# Patient Record
Sex: Female | Born: 1975
Health system: Southern US, Community
[De-identification: ages and names within clinical notes are randomized; demographics above are authoritative.]

## PROBLEM LIST (undated history)

## (undated) DIAGNOSIS — D649 Anemia, unspecified: Secondary | ICD-10-CM

## (undated) DIAGNOSIS — R7303 Prediabetes: Secondary | ICD-10-CM

## (undated) HISTORY — DX: Prediabetes: R73.03

## (undated) HISTORY — DX: Anemia, unspecified: D64.9

---

## 1998-08-09 HISTORY — PX: BREAST BIOPSY: SHX20

## 2002-08-09 HISTORY — PX: TUBAL LIGATION: SHX77

## 2006-10-14 ENCOUNTER — Ambulatory Visit: Payer: Self-pay | Admitting: Gastroenterology

## 2006-11-15 ENCOUNTER — Ambulatory Visit: Payer: Self-pay | Admitting: Gastroenterology

## 2006-12-13 ENCOUNTER — Ambulatory Visit: Payer: Self-pay | Admitting: Gastroenterology

## 2007-08-23 ENCOUNTER — Encounter: Admission: RE | Admit: 2007-08-23 | Discharge: 2007-08-23 | Payer: Self-pay | Admitting: Family Medicine

## 2010-05-18 ENCOUNTER — Encounter: Admission: RE | Admit: 2010-05-18 | Discharge: 2010-05-18 | Payer: Self-pay | Admitting: Family Medicine

## 2013-07-24 ENCOUNTER — Ambulatory Visit: Payer: Self-pay | Admitting: Family Medicine

## 2013-07-27 ENCOUNTER — Ambulatory Visit: Payer: Self-pay | Admitting: Family Medicine

## 2013-08-20 ENCOUNTER — Encounter: Payer: Self-pay | Admitting: Obstetrics & Gynecology

## 2013-08-21 ENCOUNTER — Ambulatory Visit: Payer: Self-pay | Admitting: Obstetrics and Gynecology

## 2013-08-21 ENCOUNTER — Ambulatory Visit: Payer: Self-pay | Admitting: Obstetrics & Gynecology

## 2013-08-21 ENCOUNTER — Encounter: Payer: Self-pay | Admitting: Obstetrics & Gynecology

## 2013-11-14 ENCOUNTER — Ambulatory Visit (INDEPENDENT_AMBULATORY_CARE_PROVIDER_SITE_OTHER): Payer: BC Managed Care – PPO | Admitting: Gynecology

## 2013-11-14 ENCOUNTER — Ambulatory Visit: Payer: Self-pay | Admitting: Obstetrics and Gynecology

## 2013-11-14 ENCOUNTER — Encounter: Payer: Self-pay | Admitting: Gynecology

## 2013-11-14 VITALS — BP 116/70 | Resp 18 | Ht 62.25 in | Wt 253.0 lb

## 2013-11-14 DIAGNOSIS — Z975 Presence of (intrauterine) contraceptive device: Secondary | ICD-10-CM

## 2013-11-14 DIAGNOSIS — N920 Excessive and frequent menstruation with regular cycle: Secondary | ICD-10-CM

## 2013-11-14 DIAGNOSIS — Z0000 Encounter for general adult medical examination without abnormal findings: Secondary | ICD-10-CM

## 2013-11-14 DIAGNOSIS — Z01419 Encounter for gynecological examination (general) (routine) without abnormal findings: Secondary | ICD-10-CM

## 2013-11-14 LAB — POCT URINALYSIS DIPSTICK
UROBILINOGEN UA: NEGATIVE
pH, UA: 5

## 2013-11-14 LAB — HEMOGLOBIN, FINGERSTICK: Hemoglobin, fingerstick: 12.4 g/dL (ref 12.0–16.0)

## 2013-11-14 NOTE — Patient Instructions (Signed)

## 2013-11-14 NOTE — Progress Notes (Signed)
38 y.o. Divorced  Hispanic female   G4P4 here for annual exam. Pt is currently sexually active.  Pt has an IUD placed a number of years afo but cannot recall when.  IUD was placed for menorrhagia and not contraception.  No dsypareunia.  No LMP recorded.          Sexually active: yes  The current method of family planning is tubal ligation and IUD.    Exercising: no  The patient does not participate in regular exercise at present. Last pap: 08/16/12 NEG HR HPV Alcohol: no Tobacco: no BSE: yes  Mammogram: 2014 Normal   Hgb: 12,4  ; Urine; Trace Leuks, Trace Protein    Health Maintenance  Topic Date Due  . Pap Smear  10/01/1993  . Tetanus/tdap  10/01/1994  . Influenza Vaccine  03/09/2014    Family History  Problem Relation Age of Onset  . Diabetes Maternal Grandmother   . Congestive Heart Failure Maternal Grandmother   . Depression Mother     There are no active problems to display for this patient.   Past Medical History  Diagnosis Date  . Anemia     Past Surgical History  Procedure Laterality Date  . Tubal ligation    . Breast biopsy      benign    Allergies: Review of patient's allergies indicates no known allergies.  Current Outpatient Prescriptions  Medication Sig Dispense Refill  . Acetaminophen (TYLENOL PO) Take by mouth as needed.      . IBUPROFEN PO Take by mouth as needed.      Marland Kitchen levonorgestrel (MIRENA) 20 MCG/24HR IUD 1 each by Intrauterine route once.       No current facility-administered medications for this visit.    ROS: Pertinent items are noted in HPI.  Exam:    Ht 5' 2.25" (1.581 m)  Wt 253 lb (114.76 kg)  BMI 45.91 kg/m2 Weight change: @WEIGHTCHANGE @ Last 3 height recordings:  Ht Readings from Last 3 Encounters:  11/14/13 5' 2.25" (1.581 m)   General appearance: alert, cooperative and appears stated age Head: Normocephalic, without obvious abnormality, atraumatic Neck: no adenopathy, no carotid bruit, no JVD, supple, symmetrical,  trachea midline and thyroid not enlarged, symmetric, no tenderness/mass/nodules Lungs: clear to auscultation bilaterally Breasts: normal appearance, no masses or tenderness, pendulous Heart: regular rate and rhythm, S1, S2 normal, no murmur, click, rub or gallop Abdomen: soft, non-tender; bowel sounds normal; no masses,  no organomegaly Extremities: extremities normal, atraumatic, no cyanosis or edema Skin: Skin color, texture, turgor normal. No rashes or lesions Lymph nodes: Cervical, supraclavicular, and axillary nodes normal. no inguinal nodes palpated Neurologic: Grossly normal   Pelvic: External genitalia:  no lesions              Urethra: normal appearing urethra with no masses, tenderness or lesions              Bartholins and Skenes: normal                 Vagina: normal appearing vagina with normal color and discharge, no lesions              Cervix: normal appearance, IUD strings not seen              Pap taken: no        Bimanual Exam:  Uterus:  uterus is normal size, shape, consistency and nontender, limited by habitus  Adnexa:    no masses                                      Rectovaginal: Confirms                                      Anus:  normal sphincter tone, no lesions  A: well woman Menorrhagia IUD in place     P: pap smear not done IUD for menorrhagia working well, strings not seen  counseled on breast self exam, adequate intake of calcium and vitamin D, diet and exercise return annually or prn   An After Visit Summary was printed and given to the patient.

## 2014-06-10 ENCOUNTER — Encounter: Payer: Self-pay | Admitting: Gynecology

## 2014-07-09 ENCOUNTER — Telehealth: Payer: Self-pay | Admitting: Gynecology

## 2014-07-09 NOTE — Telephone Encounter (Signed)
lmtcb to rs aex from 11/19/14

## 2014-11-19 ENCOUNTER — Ambulatory Visit: Payer: Self-pay | Admitting: Gynecology

## 2014-12-23 ENCOUNTER — Encounter (INDEPENDENT_AMBULATORY_CARE_PROVIDER_SITE_OTHER): Payer: Self-pay

## 2014-12-23 ENCOUNTER — Ambulatory Visit (INDEPENDENT_AMBULATORY_CARE_PROVIDER_SITE_OTHER): Payer: BLUE CROSS/BLUE SHIELD | Admitting: Physician Assistant

## 2014-12-23 ENCOUNTER — Encounter: Payer: Self-pay | Admitting: Physician Assistant

## 2014-12-23 VITALS — BP 130/95 | HR 74 | Temp 97.4°F | Ht 62.0 in | Wt 259.0 lb

## 2014-12-23 DIAGNOSIS — J0110 Acute frontal sinusitis, unspecified: Secondary | ICD-10-CM

## 2014-12-23 MED ORDER — AMOXICILLIN-POT CLAVULANATE 875-125 MG PO TABS: 1.0000 | ORAL_TABLET | Freq: Two times a day (BID) | ORAL | Status: DC

## 2014-12-23 NOTE — Progress Notes (Signed)
Subjective:     Patient ID: Linda Shepherd, female   DOB: 08-13-1975, 39 y.o.   MRN: [id number]  HPI Pt with 4-5 day hx of sinus pain and pressure She is having assoc S/T, dry cough, and general malaise Pt started sinus med this am  Review of Systems  Constitutional: Negative for activity change, appetite change and fatigue.  HENT: Positive for congestion, postnasal drip, rhinorrhea, sinus pressure, sore throat and voice change.   Respiratory: Positive for cough. Negative for shortness of breath, wheezing and stridor.        Objective:   Physical Exam  Constitutional: She appears well-developed and well-nourished.  HENT:  Right Ear: External ear normal.  Left Ear: External ear normal.  Mouth/Throat: Oropharynx is clear and moist. No oropharyngeal exudate.  TM's with fluid bilat + frontal sinus TTP  Neck: Neck supple.  Cardiovascular: Normal rate, regular rhythm and normal heart sounds.   No murmur heard. Pulmonary/Chest: Breath sounds normal. She has no wheezes. She has no rales.  Lymphadenopathy:    She has no cervical adenopathy.  Nursing note and vitals reviewed.      Assessment:     sinusitis    Plan:     OTC antihist/decongest Fluids  Rest Augmentin 875mg  bid #20 F/U prn

## 2014-12-23 NOTE — Patient Instructions (Signed)

## 2015-02-05 ENCOUNTER — Ambulatory Visit: Payer: BLUE CROSS/BLUE SHIELD | Admitting: Obstetrics and Gynecology

## 2015-02-13 ENCOUNTER — Encounter: Payer: Self-pay | Admitting: Obstetrics and Gynecology

## 2015-02-13 ENCOUNTER — Ambulatory Visit (INDEPENDENT_AMBULATORY_CARE_PROVIDER_SITE_OTHER): Payer: BLUE CROSS/BLUE SHIELD | Admitting: Obstetrics and Gynecology

## 2015-02-13 VITALS — BP 112/76 | HR 68 | Resp 16 | Ht 62.5 in | Wt 259.0 lb

## 2015-02-13 DIAGNOSIS — N920 Excessive and frequent menstruation with regular cycle: Secondary | ICD-10-CM | POA: Diagnosis not present

## 2015-02-13 DIAGNOSIS — Z01419 Encounter for gynecological examination (general) (routine) without abnormal findings: Secondary | ICD-10-CM

## 2015-02-13 DIAGNOSIS — Z0000 Encounter for general adult medical examination without abnormal findings: Secondary | ICD-10-CM

## 2015-02-13 LAB — POCT URINALYSIS DIPSTICK
UROBILINOGEN UA: NEGATIVE
pH, UA: 5

## 2015-02-13 MED ORDER — LORAZEPAM 1 MG PO TABS: ORAL_TABLET | ORAL | Status: DC

## 2015-02-13 MED ORDER — MISOPROSTOL 200 MCG PO TABS: ORAL_TABLET | ORAL | Status: DC

## 2015-02-13 NOTE — Patient Instructions (Signed)
Intrauterine Device Insertion Most often, an intrauterine device (IUD) is inserted into the uterus to prevent pregnancy. There are 2 types of IUDs available:  Copper IUD--This type of IUD creates an environment that is not favorable to sperm survival. The mechanism of action of the copper IUD is not known for certain. It can stay in place for 10 years.  Hormone IUD--This type of IUD contains the hormone progestin (synthetic progesterone). The progestin thickens the cervical mucus and prevents sperm from entering the uterus, and it also thins the uterine lining. There is no evidence that the hormone IUD prevents implantation. One hormone IUD can stay in place for up to 5 years, and a different hormone IUD can stay in place for up to 3 years. An IUD is the most cost-effective birth control if left in place for the full duration. It may be removed at any time. LET YOUR HEALTH CARE PROVIDER KNOW ABOUT:  Any allergies you have.  All medicines you are taking, including vitamins, herbs, eye drops, creams, and over-the-counter medicines.  Previous problems you or members of your family have had with the use of anesthetics.  Any blood disorders you have.  Previous surgeries you have had.  Possibility of pregnancy.  Medical conditions you have. RISKS AND COMPLICATIONS  Generally, intrauterine device insertion is a safe procedure. However, as with any procedure, complications can occur. Possible complications include:  Accidental puncture (perforation) of the uterus.  Accidental placement of the IUD either in the muscle layer of the uterus (myometrium) or outside the uterus. If this happens, the IUD can be found essentially floating around the bowels and must be taken out surgically.  The IUD may fall out of the uterus (expulsion). This is more common in women who have recently had a child.   Pregnancy in the fallopian tube (ectopic).  Pelvic inflammatory disease (PID), which is infection of  the uterus and fallopian tubes. The risk of PID is slightly increased in the first 20 days after the IUD is placed, but the overall risk is still very low. BEFORE THE PROCEDURE  Schedule the IUD insertion for when you will have your menstrual period or right after, to make sure you are not pregnant. Placement of the IUD is better tolerated shortly after a menstrual cycle.  You may need to take tests or be examined to make sure you are not pregnant.  You may be required to take a pregnancy test.  You may be required to get checked for sexually transmitted infections (STIs) prior to placement. Placing an IUD in someone who has an infection can make the infection worse.  You may be given a pain reliever to take 1 or 2 hours before the procedure.  An exam will be performed to determine the size and position of your uterus.  Ask your health care provider about changing or stopping your regular medicines. PROCEDURE   A tool (speculum) is placed in the vagina. This allows your health care provider to see the lower part of the uterus (cervix).  The cervix is prepped with a medicine that lowers the risk of infection.  You may be given a medicine to numb each side of the cervix (intracervical or paracervical block). This is used to block and control any discomfort with insertion.  A tool (uterine sound) is inserted into the uterus to determine the length of the uterine cavity and the direction the uterus may be tilted.  A slim instrument (IUD inserter) is inserted through the cervical   canal and into your uterus.  The IUD is placed in the uterine cavity and the insertion device is removed.  The nylon string that is attached to the IUD and used for eventual IUD removal is trimmed. It is trimmed so that it lays high in the vagina, just outside the cervix. AFTER THE PROCEDURE  You may have bleeding after the procedure. This is normal. It varies from light spotting for a few days to menstrual-like  bleeding.  You may have mild cramping. Document Released: 03/24/2011 Document Revised: 05/16/2013 Document Reviewed: 01/14/2013 ExitCare Patient Information 2015 [employer]. This information is not intended to replace advice given to you by your health care provider. Make sure you discuss any questions you have with your health care provider.  

## 2015-02-13 NOTE — Progress Notes (Signed)
39 y.o. G58P4 Divorced Hispanic female here for annual exam.  The patient has had a mirena IUD in for heavy cycles, it has been in for over 5 years. Last year the strings weren't seen. Prior to the mirena she was anemic. Now just occasional spotting, occasional light cycle. Sexually active, same partner x 2 years, not using condoms. No dyspareunia. No h/o dysplasia, no h/o STD's.  PCP:   Redge Gainer, MD  No LMP recorded. Patient is not currently having periods (Reason: IUD).          Sexually active: Yes.    The current method of family planning is tubal ligation and IUD.    Exercising: No.  The patient does not participate in regular exercise at present. Smoker:  no  Health Maintenance: Pap:  08/16/12 neg hr hpv History of abnormal Pap:  Yes, years ago f/u papsmear normal ever since. MMG:  4 years ago normal  Colonoscopy:  n/a BMD:   n/a  Result  n/a TDaP:  2012 Screening Labs:  Hb today: 11.5 , Urine today: wbc's ++   reports that she has never smoked. She has never used smokeless tobacco. She reports that she does not drink alcohol or use illicit drugs.  Past Medical History  Diagnosis Date  . Anemia     Past Surgical History  Procedure Laterality Date  . Tubal ligation  2004    postpartum  . Breast biopsy  2000    benign    Current Outpatient Prescriptions  Medication Sig Dispense Refill  . IBUPROFEN PO Take by mouth as needed.    Marland Kitchen levonorgestrel (MIRENA) 20 MCG/24HR IUD 1 each by Intrauterine route once.    Marland Kitchen LORazepam (ATIVAN) 1 MG tablet Take 1 tab, 1 hour prior to procedure 1 tablet 0  . misoprostol (CYTOTEC) 200 MCG tablet 2 tabs per vagina 6-12 hours prior to IUD insertion 2 tablet 0   No current facility-administered medications for this visit.    Family History  Problem Relation Age of Onset  . Diabetes Maternal Grandmother   . Congestive Heart Failure Maternal Grandmother   . Depression Mother     ROS:  Pertinent items are noted in HPI.  Otherwise, a  comprehensive ROS was negative.  Exam:   BP 112/76 mmHg  Pulse 68  Resp 16  Ht 5' 2.5" (1.588 m)  Wt 259 lb (117.482 kg)  BMI 46.59 kg/m2    General appearance: alert, cooperative and appears stated age Head: Normocephalic, without obvious abnormality, atraumatic Neck: no adenopathy, supple, symmetrical, trachea midline and thyroid normal to inspection and palpation Lungs: clear to auscultation bilaterally Breasts: normal appearance, no masses or tenderness Heart: regular rate and rhythm Abdomen: soft, non-tender; bowel sounds normal; no masses,  no organomegaly Extremities: extremities normal, atraumatic, no cyanosis or edema Skin: Skin color, texture, turgor normal. No rashes or lesions Lymph nodes: Cervical, supraclavicular, and axillary nodes normal. No abnormal inguinal nodes palpated Neurologic: Grossly normal  Pelvic: External genitalia:  no lesions              Urethra:  normal appearing urethra with no masses, tenderness or lesions              Bartholins and Skenes: normal                 Vagina: normal appearing vagina with normal color and discharge, no lesions              Cervix: IUD string 1  cm              Pap taken: No. Bimanual Exam:  Uterus:  normal size, contour, position, consistency, mobility, non-tender              Adnexa: no mass, fullness, tenderness              Rectovaginal: Yes.  .  Confirms.              Anus:  normal sphincter tone, no lesions  Chaperone was present for exam.  Assessment:   Well woman visit with normal exam. H/O anemia from menorrhagia Due for a new mirena IUD  Plan: Yearly mammogram recommended after age 58.  Recommended self breast exam.  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  Yes.  .   See orders. Declines STD testing today  After visit summary provided.

## 2015-02-14 LAB — LIPID PANEL
CHOLESTEROL: 204 mg/dL — AB (ref 0–200)
HDL: 48 mg/dL (ref >=46)
LDL Cholesterol: 126 mg/dL — ABNORMAL HIGH (ref 0–99)
Total CHOL/HDL Ratio: 4.3 Ratio
Triglycerides: 148 mg/dL (ref <150)
VLDL: 30 mg/dL (ref 0–40)

## 2015-02-14 LAB — COMPREHENSIVE METABOLIC PANEL
ALK PHOS: 76 U/L (ref 39–117)
ALT: 14 U/L (ref 0–35)
AST: 15 U/L (ref 0–37)
Albumin: 3.9 g/dL (ref 3.5–5.2)
BILIRUBIN TOTAL: 0.3 mg/dL (ref 0.2–1.2)
BUN: 13 mg/dL (ref 6–23)
CALCIUM: 9.6 mg/dL (ref 8.4–10.5)
CO2: 28 mEq/L (ref 19–32)
Chloride: 99 mEq/L (ref 96–112)
Creat: 0.72 mg/dL (ref 0.50–1.10)
Glucose, Bld: 85 mg/dL (ref 70–99)
Potassium: 4.3 mEq/L (ref 3.5–5.3)
SODIUM: 138 meq/L (ref 135–145)
TOTAL PROTEIN: 6.8 g/dL (ref 6.0–8.3)

## 2015-02-14 LAB — CBC
HEMATOCRIT: 38.2 % (ref 36.0–46.0)
Hemoglobin: 12.1 g/dL (ref 12.0–15.0)
MCH: 27.3 pg (ref 26.0–34.0)
MCHC: 31.7 g/dL (ref 30.0–36.0)
MCV: 86.2 fL (ref 78.0–100.0)
MPV: 10.4 fL (ref 8.6–12.4)
Platelets: 254 10*3/uL (ref 150–400)
RBC: 4.43 MIL/uL (ref 3.87–5.11)
RDW: 15 % (ref 11.5–15.5)
WBC: 6.7 10*3/uL (ref 4.0–10.5)

## 2015-02-17 LAB — HEMOGLOBIN, FINGERSTICK: HEMOGLOBIN, FINGERSTICK: 11.5 g/dL — AB (ref 12.0–16.0)

## 2015-04-03 ENCOUNTER — Telehealth: Payer: Self-pay | Admitting: Obstetrics and Gynecology

## 2015-04-03 NOTE — Telephone Encounter (Signed)
Called patient to review benefits. Patient is agreeable. Advised patient to call with cycle. Routing to provider for final review.

## 2015-05-13 NOTE — Telephone Encounter (Signed)
Spoke with patient. Patient states that Dr.Jertson was waiting for records to verify when she had her Mirena placed. Advised I will speak with Dr.Jertson regarding these records to see if she has received them. Will return call with further recommendations for scheduling. Patient is agreeable.

## 2015-05-13 NOTE — Telephone Encounter (Signed)
Spoke with patient to review benefits for iud insertion. Patient believes she needs to have records sent to our office prior to scheduling. Not noted in office visit. Transferred to [facility] to discuss prior to scheduling.

## 2015-05-15 NOTE — Telephone Encounter (Signed)
Left message to call Meleana Commerford at [phone]. 

## 2015-05-15 NOTE — Telephone Encounter (Signed)
I don't need her records prior to scheduling unless the patient thinks she hasn't had the IUD in for 5 years. She did have an IUD in her uterus in 10/11 when she had a CT. Does she remember if that was this IUD, if so she is due.

## 2015-05-21 ENCOUNTER — Telehealth: Payer: Self-pay | Admitting: Obstetrics and Gynecology

## 2015-05-21 NOTE — Telephone Encounter (Signed)
Left message for pt to call regarding medical records release mailed to office. Where are we getting records from?

## 2015-05-28 NOTE — Telephone Encounter (Signed)
Left message to call Kaitlyn at [phone]. 

## 2015-06-03 NOTE — Telephone Encounter (Signed)
Spoke with patient. Advised of message as seen below from [place]. Patient is agreeable. States she has only had one IUD so it must have been the same as the one in the CT scan from 2011. Per patient does not have regular cycles with IUD. IUD removal and reinsertion scheduled for 11/2 at 9 am with Dr.Jertson. Patient is agreeable to date and time. Pre procedure instructions given.  Motrin instructions given. Motrin=Advil=Ibuprofen, 800 mg one hour before appointment. Eat a meal and hydrate well before appointment. States she was given a Valium by Dr.Jertson. Advised she will need a driver to and from the appointment. Patient is agreeable.  Routing to provider for final review. Patient agreeable to disposition. Will close encounter.

## 2015-06-09 ENCOUNTER — Telehealth: Payer: Self-pay | Admitting: Obstetrics and Gynecology

## 2015-06-09 NOTE — Telephone Encounter (Signed)
Spoke with patient. Patient would like to reschedule IUD removal and reinsertion. Would like to move this up to 11/1. Appointment moved to 11/1 at 8:12 am with Dr.Jertson. Patient is agreeable to date and time.   Routing to provider for final review. Patient agreeable to disposition. Will close encounter.

## 2015-06-09 NOTE — Telephone Encounter (Signed)
Patient would like to reschedule upcoming iud replacement appointment. Patient would like to come in tomorrow if possible. (fyi: I did not cx upcoming appointment).

## 2015-06-10 ENCOUNTER — Ambulatory Visit (INDEPENDENT_AMBULATORY_CARE_PROVIDER_SITE_OTHER): Payer: BLUE CROSS/BLUE SHIELD | Admitting: Obstetrics and Gynecology

## 2015-06-10 ENCOUNTER — Encounter: Payer: Self-pay | Admitting: Obstetrics and Gynecology

## 2015-06-10 VITALS — BP 102/72 | HR 60 | Resp 16 | Wt 258.0 lb

## 2015-06-10 DIAGNOSIS — Z30433 Encounter for removal and reinsertion of intrauterine contraceptive device: Secondary | ICD-10-CM | POA: Diagnosis not present

## 2015-06-10 NOTE — Patient Instructions (Signed)

## 2015-06-10 NOTE — Progress Notes (Signed)
Patient ID: Linda Shepherd, female   DOB: [birth year], 39 y.o.   MRN: [id number] GYNECOLOGY  VISIT   HPI: 39 y.o.   Divorced  Hispanic  female   G70P4 with No LMP recorded. Patient is not currently having periods (Reason: IUD).   Here for IUD removal and a new Mirena IUD inserted.   GYNECOLOGIC HISTORY: No LMP recorded. Patient is not currently having periods (Reason: IUD). Contraception:Tubal Ligation and IUD Menopausal hormone therapy: N/A        OB History    Gravida Para Term Preterm AB TAB SAB Ectopic Multiple Living   4 4        4          Patient Active Problem List   Diagnosis Date Noted  . IUD (intrauterine device) in place 11/14/2013    Past Medical History  Diagnosis Date  . Anemia     Past Surgical History  Procedure Laterality Date  . Tubal ligation  2004    postpartum  . Breast biopsy  2000    benign    Current Outpatient Prescriptions  Medication Sig Dispense Refill  . IBUPROFEN PO Take by mouth as needed.    Marland Kitchen levonorgestrel (MIRENA) 20 MCG/24HR IUD 1 each by Intrauterine route once.    Marland Kitchen levonorgestrel (MIRENA) 20 MCG/24HR IUD 1 Intra Uterine Device (1 each total) by Intrauterine route once. 1 Intra Uterine Device 0  . LORazepam (ATIVAN) 1 MG tablet Take 1 tab, 1 hour prior to procedure 1 tablet 0  . misoprostol (CYTOTEC) 200 MCG tablet 2 tabs per vagina 6-12 hours prior to IUD insertion 2 tablet 0   No current facility-administered medications for this visit.     ALLERGIES: Review of patient's allergies indicates no known allergies.  Family History  Problem Relation Age of Onset  . Diabetes Maternal Grandmother   . Congestive Heart Failure Maternal Grandmother   . Depression Mother     Social History   Social History  . Marital Status: Divorced    Spouse Name: N/A  . Number of Children: N/A  . Years of Education: N/A   Occupational History  . Not on file.   Social History Main Topics  . Smoking status: Never Smoker   . Smokeless  tobacco: Never Used  . Alcohol Use: No     Comment: occ wine  . Drug Use: No  . Sexual Activity:    Partners: Male    Birth Control/ Protection: IUD   Other Topics Concern  . Not on file   Social History Narrative    Review of Systems  All other systems reviewed and are negative.   PHYSICAL EXAMINATION:    There were no vitals taken for this visit.    General appearance: alert, cooperative and appears stated age  Pelvic: External genitalia:  no lesions              Urethra:  normal appearing urethra with no masses, tenderness or lesions              Bartholins and Skenes: normal                 Vagina: normal appearing vagina with normal color and discharge, no lesions              Cervix: no lesions and IUD strings seen               Chaperone was present for exam.  The risks of the mirena IUD  were reviewed with the patient, including infection, abnormal bleeding and uterine perfortion. Consent was signed.  A speculum was placed in the vagina, the IUD was removed with ringed forceps. The cervix was cleansed with betadine. A tenaculum was placed on the cervix, the uterus sounded to 9 cm. The cervix was dilated to a 5 Hagar dilator.  The mirena IUD was inserted without difficulty. The string were cut to 3-4 cm. The tenaculum was removed. Slight oozing from the tenaculum site was stopped with pressure.   The patient tolerated the procedure well.    ASSESSMENT Mirena IUD removal and reinsertion    PLAN F/U in 1 month Call with any concerns   An After Visit Summary was printed and given to the patient.

## 2015-06-11 ENCOUNTER — Ambulatory Visit: Payer: BLUE CROSS/BLUE SHIELD | Admitting: Obstetrics and Gynecology

## 2015-09-30 ENCOUNTER — Ambulatory Visit (INDEPENDENT_AMBULATORY_CARE_PROVIDER_SITE_OTHER): Payer: BLUE CROSS/BLUE SHIELD | Admitting: Family Medicine

## 2015-09-30 ENCOUNTER — Encounter: Payer: Self-pay | Admitting: Family Medicine

## 2015-09-30 VITALS — BP 119/77 | HR 96 | Temp 98.8°F | Ht 62.5 in | Wt 259.6 lb

## 2015-09-30 DIAGNOSIS — J101 Influenza due to other identified influenza virus with other respiratory manifestations: Secondary | ICD-10-CM

## 2015-09-30 DIAGNOSIS — R059 Cough, unspecified: Secondary | ICD-10-CM

## 2015-09-30 DIAGNOSIS — J0110 Acute frontal sinusitis, unspecified: Secondary | ICD-10-CM

## 2015-09-30 DIAGNOSIS — J029 Acute pharyngitis, unspecified: Secondary | ICD-10-CM

## 2015-09-30 DIAGNOSIS — R05 Cough: Secondary | ICD-10-CM | POA: Diagnosis not present

## 2015-09-30 DIAGNOSIS — R509 Fever, unspecified: Secondary | ICD-10-CM

## 2015-09-30 LAB — POCT INFLUENZA A/B
INFLUENZA A, POC: POSITIVE — AB
INFLUENZA B, POC: NEGATIVE

## 2015-09-30 LAB — POCT RAPID STREP A (OFFICE): Rapid Strep A Screen: NEGATIVE

## 2015-09-30 MED ORDER — OSELTAMIVIR PHOSPHATE 75 MG PO CAPS: 75.0000 mg | ORAL_CAPSULE | Freq: Two times a day (BID) | ORAL | Status: DC

## 2015-09-30 NOTE — Progress Notes (Signed)
Subjective:  Patient ID: Linda Shepherd, female    DOB: [birth year]  Age: 40 y.o. MRN: [id number]  CC: Generalized Body Aches   HPI Linda Shepherd presents for   Patient presents with dry cough runny stuffy nose. Diffuse headache of moderate intensity. Patient also has chills and subjective fever. Body aches  in the legs, shoulders, and torso as well. Has sapped the energy . Onset 3 days ago.   History Linda Shepherd has a past medical history of Anemia.   Linda Shepherd has past surgical history that includes Tubal ligation (2004) and Breast biopsy (2000).   Her family history includes Congestive Heart Failure in her maternal grandmother; Depression in her mother; Diabetes in her maternal grandmother.Linda Shepherd reports that Linda Shepherd has never smoked. Linda Shepherd has never used smokeless tobacco. Linda Shepherd reports that Linda Shepherd does not drink alcohol or use illicit drugs.    ROS Review of Systems  Constitutional: Positive for fever, chills and appetite change.  HENT: Positive for congestion and rhinorrhea. Negative for ear pain, nosebleeds, postnasal drip, sinus pressure and sore throat.   Respiratory: Negative for chest tightness and shortness of breath.   Cardiovascular: Negative for chest pain.  Musculoskeletal: Positive for myalgias.  Skin: Negative for rash.  Neurological: Positive for headaches.    Objective:  BP 119/77 mmHg  Pulse 96  Temp(Src) 98.8 F (37.1 C) (Oral)  Ht 5' 2.5" (1.588 m)  Wt 259 lb 9.6 oz (117.754 kg)  BMI 46.70 kg/m2  SpO2 98%  BP Readings from Last 3 Encounters:  09/30/15 119/77  06/10/15 102/72  02/13/15 112/76    Wt Readings from Last 3 Encounters:  09/30/15 259 lb 9.6 oz (117.754 kg)  06/10/15 258 lb (117.028 kg)  02/13/15 259 lb (117.482 kg)     Physical Exam  Constitutional: Linda Shepherd appears well-developed and well-nourished.  HENT:  Head: Normocephalic and atraumatic.  Right Ear: Tympanic membrane and external ear normal. No decreased hearing is noted.  Left Ear: Tympanic  membrane and external ear normal. No decreased hearing is noted.  Nose: Mucosal edema present. Right sinus exhibits no frontal sinus tenderness. Left sinus exhibits no frontal sinus tenderness.  Mouth/Throat: No oropharyngeal exudate or posterior oropharyngeal erythema.  Neck: No Brudzinski's sign noted.  Pulmonary/Chest: Breath sounds normal. No respiratory distress.  Lymphadenopathy:       Head (right side): No preauricular adenopathy present.       Head (left side): No preauricular adenopathy present.       Right cervical: No superficial cervical adenopathy present.      Left cervical: No superficial cervical adenopathy present.     Lab Results  Component Value Date   WBC 6.7 02/13/2015   HGB 12.1 02/13/2015   HCT 38.2 02/13/2015   PLT 254 02/13/2015   GLUCOSE 85 02/13/2015   CHOL 204* 02/13/2015   TRIG 148 02/13/2015   HDL 48 02/13/2015   LDLCALC 126* 02/13/2015   ALT 14 02/13/2015   AST 15 02/13/2015   NA 138 02/13/2015   K 4.3 02/13/2015   CL 99 02/13/2015   CREATININE 0.72 02/13/2015   BUN 13 02/13/2015   CO2 28 02/13/2015    Ct Abdomen Pelvis W Wo Contrast  05/18/2010  Clinical Data: Bilateral flank and right left lower quadrant pain for 3 days.  Nausea.  Hematuria.  Diarrhea.  CT ABDOMEN AND PELVIS WITHOUT AND WITH CONTRAST  Technique:  Multidetector CT imaging of the abdomen and pelvis was performed without contrast material in one or both body regions, followed  by contrast material(s) and further sections in one or both body regions.  Contrast: 100 ml Omnipaque-300  Comparison: 08/23/2007  Findings: There are no renal or ureteral calculi.  There are no bladder calculi.  The liver, spleen, pancreas, and adrenal glands are normal.  There is a 4 cm low density lesion in the right ovary.  The right ovary is more prominent than on the prior study.  The left ovary is normal.  There is evidence of an IUD in place as well as clips from previous bilateral tubal ligation.  The  terminal ileum and appendix are normal.  No diverticular disease.  No significant osseous abnormality.  IMPRESSION:  1.  4 cm low density lesion in the right ovary, nonspecific. Pelvic ultrasound may be useful for further evaluation of this indeterminate ovarian lesion. 2.  Otherwise essentially normal exam. Provider: Cleda Mccreedy   Assessment & Plan:   Linda Shepherd was seen today for generalized body aches.  Diagnoses and all orders for this visit:  Sore throat -     POCT Influenza A/B -     POCT rapid strep A  Fever, unspecified -     POCT Influenza A/B -     POCT rapid strep A  Cough -     POCT Influenza A/B -     POCT rapid strep A  Acute frontal sinusitis, recurrence not specified  Influenza A  Other orders -     oseltamivir (TAMIFLU) 75 MG capsule; Take 1 capsule (75 mg total) by mouth 2 (two) times daily.      I have discontinued Linda Shepherd's misoprostol. I am also having her start on oseltamivir. Additionally, I am having her maintain her IBUPROFEN PO and levonorgestrel.  Meds ordered this encounter  Medications  . oseltamivir (TAMIFLU) 75 MG capsule    Sig: Take 1 capsule (75 mg total) by mouth 2 (two) times daily.    Dispense:  10 capsule    Refill:  0     Follow-up: Return if symptoms worsen or fail to improve.  Claretta Fraise, M.D.

## 2015-10-15 ENCOUNTER — Encounter: Payer: Self-pay | Admitting: Pediatrics

## 2015-10-15 ENCOUNTER — Ambulatory Visit (INDEPENDENT_AMBULATORY_CARE_PROVIDER_SITE_OTHER): Payer: BLUE CROSS/BLUE SHIELD | Admitting: Pediatrics

## 2015-10-15 VITALS — BP 118/80 | HR 88 | Temp 98.3°F | Ht 62.5 in | Wt 263.6 lb

## 2015-10-15 DIAGNOSIS — J209 Acute bronchitis, unspecified: Secondary | ICD-10-CM

## 2015-10-15 DIAGNOSIS — J069 Acute upper respiratory infection, unspecified: Secondary | ICD-10-CM | POA: Diagnosis not present

## 2015-10-15 MED ORDER — AZITHROMYCIN 250 MG PO TABS: ORAL_TABLET | ORAL | Status: DC

## 2015-10-15 NOTE — Patient Instructions (Signed)
Flonase two sprays twice a day  Ibuprofen three times a day  If getting worse start the antibiotic If not improving by Sunday, start the antibiotic

## 2015-10-15 NOTE — Progress Notes (Signed)
    Subjective:    Patient ID: Linda Shepherd, female    DOB: 1975-10-12, 40 y.o.   MRN: [id number]  CC: Cough; Sore Throat; and Ear Pain   HPI: Linda Shepherd is a 40 y.o. female presenting for Cough; Sore Throat; and Ear Pain  Had flu 2/21, was prescribed tamiflu but told that she shouldn't take it  Feeling better, almost back to normal Then 4 days ago started feeling sick again, sore throat, lots ears are sore, throat is sore, coughing up green-grey No fevers    Depression screen PHQ 2/9 12/23/2014  Decreased Interest 0  Down, Depressed, Hopeless 0  PHQ - 2 Score 0     Relevant past medical, surgical, family and social history reviewed and updated as indicated. Interim medical history since our last visit reviewed. Allergies and medications reviewed and updated.    ROS: Per HPI unless specifically indicated above  History  Smoking status  . Never Smoker   Smokeless tobacco  . Never Used    Past Medical History Patient Active Problem List   Diagnosis Date Noted  . IUD (intrauterine device) in place 11/14/2013       Objective:    BP 118/80 mmHg  Pulse 88  Temp(Src) 98.3 F (36.8 C) (Oral)  Ht 5' 2.5" (1.588 m)  Wt 263 lb 9.6 oz (119.568 kg)  BMI 47.41 kg/m2  SpO2 98%  Wt Readings from Last 3 Encounters:  10/15/15 263 lb 9.6 oz (119.568 kg)  09/30/15 259 lb 9.6 oz (117.754 kg)  06/10/15 258 lb (117.028 kg)    Gen: NAD, alert, cooperative with exam, NCAT, coughing, congested EYES: EOMI, no scleral injection or icterus ENT:  TMs dull gray b/l, OP with mild erythema LYMPH: no cervical LAD CV: NRRR, normal S1/S2, no murmur, distal pulses 2+ b/l Resp: CTABL, no wheezes, normal WOB Abd: +BS, soft, NTND.  Ext: No edema, warm Neuro: Alert and oriented MSK: normal muscle bulk     Assessment & Plan:    Floe was seen today for cough, sore throat and ear pain, symptoms ongoing for over a week.  Diagnoses and all orders for this visit:  Acute  bronchitis, unspecified organism -     azithromycin (ZITHROMAX) 250 MG tablet; Take 2 the first day and then one each day after.  Acute URI Symptomatic care  Follow up plan: Return if symptoms worsen or fail to improve.  Assunta Found, MD [facility] 10/15/2015, 7:18 PM

## 2015-11-07 ENCOUNTER — Encounter: Payer: Self-pay | Admitting: Gastroenterology

## 2015-11-07 ENCOUNTER — Ambulatory Visit (INDEPENDENT_AMBULATORY_CARE_PROVIDER_SITE_OTHER): Payer: BLUE CROSS/BLUE SHIELD | Admitting: Gastroenterology

## 2015-11-07 ENCOUNTER — Other Ambulatory Visit: Payer: Self-pay

## 2015-11-07 VITALS — BP 136/81 | HR 88 | Temp 98.3°F | Ht 63.0 in | Wt 258.2 lb

## 2015-11-07 DIAGNOSIS — K625 Hemorrhage of anus and rectum: Secondary | ICD-10-CM

## 2015-11-07 DIAGNOSIS — K6289 Other specified diseases of anus and rectum: Secondary | ICD-10-CM | POA: Diagnosis not present

## 2015-11-07 MED ORDER — PEG-KCL-NACL-NASULF-NA ASC-C 100 G PO SOLR: 1.0000 | ORAL | Status: DC

## 2015-11-07 MED ORDER — HYDROCORTISONE 2.5 % RE CREA: 1.0000 "application " | TOPICAL_CREAM | Freq: Two times a day (BID) | RECTAL | Status: DC

## 2015-11-07 NOTE — Patient Instructions (Signed)
1. Anusol cream apply rectally twice daily for 2 weeks. 2. Colonoscopy as scheduled. Please see separate instructions.

## 2015-11-07 NOTE — Progress Notes (Signed)
Primary Care Physician:  Barney Drain, MD  Primary Gastroenterologist:  Barney Drain, MD   Chief Complaint  Patient presents with  . Anal Fissure    HPI:  Linda Shepherd is a 40 y.o. female here for further evaluation of rectal pain and rectal bleeding. She was seen remotely by our practice but I do not have access to those records at this time.   She complains of several week h/o persistent rectal pain associated with BMs, rectal bleeding. Stools are soft but large. She denies any medication for these symptoms. Tries to stay regular with healthy diet and adequate water intake.  No abdominal pain. Appetite is good. BM every other day. No heartburn. No prior colonoscopy.   She had the flu back in 09/2015 but otherwise has been healthy.    Current Outpatient Prescriptions  Medication Sig Dispense Refill  . IBUPROFEN PO Take by mouth as needed.    Marland Kitchen levonorgestrel (MIRENA) 20 MCG/24HR IUD 1 Intra Uterine Device (1 each total) by Intrauterine route once. 1 Intra Uterine Device 0  . hydrocortisone (ANUSOL-HC) 2.5 % rectal cream Place 1 application rectally 2 (two) times daily. 30 g 0  . peg 3350 powder (MOVIPREP) 100 g SOLR Take 1 kit (200 g total) by mouth as directed. 1 kit 0   No current facility-administered medications for this visit.    Allergies as of 11/07/2015  . (No Known Allergies)    Past Medical History  Diagnosis Date  . Anemia     Past Surgical History  Procedure Laterality Date  . Tubal ligation  2004    postpartum  . Breast biopsy  2000    benign    Family History  Problem Relation Age of Onset  . Diabetes Maternal Grandmother   . Congestive Heart Failure Maternal Grandmother   . Depression Mother   . Colon cancer Neg Hx     Social History   Social History  . Marital Status: Divorced    Spouse Name: N/A  . Number of Children: 4  . Years of Education: N/A   Occupational History  . works in Programmer, applications and as an Veterinary surgeon at [employer]  . Smoking status: Never Smoker   . Smokeless tobacco: Never Used  . Alcohol Use: No     Comment: occ wine  . Drug Use: No  . Sexual Activity:    Partners: Male    Birth Control/ Protection: IUD   Other Topics Concern  . Not on file   Social History Narrative      ROS:  General: Negative for anorexia, weight loss, fever, chills, fatigue, weakness. Eyes: Negative for vision changes.  ENT: Negative for hoarseness, difficulty swallowing , nasal congestion. CV: Negative for chest pain, angina, palpitations, dyspnea on exertion, peripheral edema.  Respiratory: Negative for dyspnea at rest, dyspnea on exertion, cough, sputum, wheezing.  GI: See history of present illness. GU:  Negative for dysuria, hematuria, urinary incontinence, urinary frequency, nocturnal urination.  MS: Negative for joint pain, low back pain.  Derm: Negative for rash or itching.  Neuro: Negative for weakness, abnormal sensation, seizure, frequent headaches, memory loss, confusion.  Psych: Negative for anxiety, depression, suicidal ideation, hallucinations.  Endo: Negative for unusual weight change.  Heme: Negative for bruising or bleeding. Allergy: Negative for rash or hives.    Physical Examination:  BP 136/81 mmHg  Pulse 88  Temp(Src) 98.3 F (36.8 C)  Ht '5\' 3"'$  (1.6 m)  Wt 258 lb 3.2 oz (117.119 kg)  BMI 45.75 kg/m2   General: Well-nourished, well-developed in no acute distress.  Head: Normocephalic, atraumatic.   Eyes: Conjunctiva pink, no icterus. Mouth: Oropharyngeal mucosa moist and pink , no lesions erythema or exudate. Neck: Supple without thyromegaly, masses, or lymphadenopathy.  Lungs: Clear to auscultation bilaterally.  Heart: Regular rate and rhythm, no murmurs rubs or gallops.  Abdomen: Bowel sounds are normal, nontender, nondistended, no hepatosplenomegaly or masses, no abdominal bruits or    hernia , no rebound or guarding.   Rectal:  deferred Extremities: No lower extremity edema. No clubbing or deformities.  Neuro: Alert and oriented x 4 , grossly normal neurologically.  Skin: Warm and dry, no rash or jaundice.   Psych: Alert and cooperative, normal mood and affect.  Labs: Lab Results  Component Value Date   WBC 6.7 02/13/2015   HGB 12.1 02/13/2015   HCT 38.2 02/13/2015   MCV 86.2 02/13/2015   PLT 254 02/13/2015   Lab Results  Component Value Date   CREATININE 0.72 02/13/2015   BUN 13 02/13/2015   NA 138 02/13/2015   K 4.3 02/13/2015   CL 99 02/13/2015   CO2 28 02/13/2015   Lab Results  Component Value Date   ALT 14 02/13/2015   AST 15 02/13/2015   ALKPHOS 76 02/13/2015   BILITOT 0.3 02/13/2015     Imaging Studies: No results found.

## 2015-11-07 NOTE — Assessment & Plan Note (Signed)
40 y/o English speaking Hispanic female who presents with several week history of intermittent rectal bleeding associated with rectal pain/painful defecation. Denies hard stools. History of anal fissure in the remote past. Suspect benign etiology such as anal fissure versus hemorrhoids. She has never had a colonoscopy. Offered colonoscopy for diagnostic purposes.  I have discussed the risks, alternatives, benefits with regards to but not limited to the risk of reaction to medication, bleeding, infection, perforation and the patient is agreeable to proceed. Written consent to be obtained.  Start anusol cream bid for 14 days. Keep stools soft. Patient refuses medication for this and will increase dietary fiber and water consumption.

## 2015-11-10 NOTE — Progress Notes (Signed)
CC'D TO PCP °

## 2015-12-08 ENCOUNTER — Telehealth: Payer: Self-pay

## 2015-12-08 NOTE — Telephone Encounter (Signed)
noted 

## 2015-12-08 NOTE — Telephone Encounter (Signed)
Pt is changing job and her insurance will be changing. She would like to wait until she has the new insurance

## 2015-12-12 ENCOUNTER — Ambulatory Visit (HOSPITAL_COMMUNITY)
Admission: RE | Admit: 2015-12-12 | Payer: BLUE CROSS/BLUE SHIELD | Source: Ambulatory Visit | Admitting: Gastroenterology

## 2015-12-12 ENCOUNTER — Encounter (HOSPITAL_COMMUNITY): Admission: RE | Payer: Self-pay | Source: Ambulatory Visit

## 2015-12-12 SURGERY — COLONOSCOPY
Anesthesia: Moderate Sedation

## 2015-12-18 ENCOUNTER — Telehealth: Payer: Self-pay | Admitting: Family

## 2015-12-18 NOTE — Telephone Encounter (Signed)
Patient last seen, October 15, 2015.  Complains of ear pain and wants script sent in?  Please advise.

## 2015-12-19 ENCOUNTER — Ambulatory Visit (INDEPENDENT_AMBULATORY_CARE_PROVIDER_SITE_OTHER): Payer: BLUE CROSS/BLUE SHIELD | Admitting: Family Medicine

## 2015-12-19 ENCOUNTER — Encounter: Payer: Self-pay | Admitting: Family Medicine

## 2015-12-19 VITALS — BP 130/88 | HR 81 | Temp 98.3°F | Ht 63.0 in | Wt 261.0 lb

## 2015-12-19 DIAGNOSIS — J069 Acute upper respiratory infection, unspecified: Secondary | ICD-10-CM

## 2015-12-19 MED ORDER — FLUTICASONE PROPIONATE 50 MCG/ACT NA SUSP: 1.0000 | Freq: Two times a day (BID) | NASAL | Status: DC | PRN

## 2015-12-19 NOTE — Telephone Encounter (Signed)
Pt needs to be seen

## 2015-12-19 NOTE — Progress Notes (Signed)
BP 130/88 mmHg  Pulse 81  Temp(Src) 98.3 F (36.8 C) (Oral)  Ht 5' 3"  (1.6 m)  Wt 261 lb (118.389 kg)  BMI 46.25 kg/m2   Subjective:    Patient ID: Linda Shepherd, female    DOB: 05-31-1976, 40 y.o.   MRN: [id number]  HPI: Linda Shepherd is a 40 y.o. female presenting on 12/19/2015 for Ear Pain   HPI Ear pain and nasal congestion and sore throat Patient comes in today with complaints of left ear pain and sinus congestion and postnasal drainage and sore throat that's been going on for the past few days. She usually gets allergies as time year but never to this point where it has caused the congestion to back up into her ear. She denies any fevers or chills or shortness of breath or wheezing. She denies any sick contacts that she knows of.  Relevant past medical, surgical, family and social history reviewed and updated as indicated. Interim medical history since our last visit reviewed. Allergies and medications reviewed and updated.  Review of Systems  Constitutional: Negative for fever and chills.  HENT: Positive for congestion, ear pain, postnasal drip, rhinorrhea, sinus pressure, sneezing and sore throat. Negative for ear discharge.   Eyes: Negative for pain, redness and visual disturbance.  Respiratory: Positive for cough. Negative for chest tightness and shortness of breath.   Cardiovascular: Negative for chest pain and leg swelling.  Genitourinary: Negative for dysuria and difficulty urinating.  Musculoskeletal: Negative for back pain and gait problem.  Skin: Negative for rash.  Neurological: Negative for light-headedness and headaches.  Psychiatric/Behavioral: Negative for behavioral problems and agitation.  All other systems reviewed and are negative.   Per HPI unless specifically indicated above     Medication List       This list is accurate as of: 12/19/15  6:22 PM.  Always use your most recent med list.               fluticasone 50 MCG/ACT nasal spray    Commonly known as:  FLONASE  Place 1 spray into both nostrils 2 (two) times daily as needed for allergies or rhinitis.     IBUPROFEN PO  Take by mouth as needed.     levonorgestrel 20 MCG/24HR IUD  Commonly known as:  MIRENA  1 Intra Uterine Device (1 each total) by Intrauterine route once.           Objective:    BP 130/88 mmHg  Pulse 81  Temp(Src) 98.3 F (36.8 C) (Oral)  Ht 5' 3"  (1.6 m)  Wt 261 lb (118.389 kg)  BMI 46.25 kg/m2  Wt Readings from Last 3 Encounters:  12/19/15 261 lb (118.389 kg)  11/07/15 258 lb 3.2 oz (117.119 kg)  10/15/15 263 lb 9.6 oz (119.568 kg)    Physical Exam  Constitutional: She is oriented to person, place, and time. She appears well-developed and well-nourished. No distress.  HENT:  Right Ear: Tympanic membrane, external ear and ear canal normal.  Left Ear: External ear and ear canal normal. Tympanic membrane is retracted. Tympanic membrane is not injected, not erythematous and not bulging.  No middle ear effusion.  Nose: Mucosal edema and rhinorrhea present. No epistaxis. Right sinus exhibits no maxillary sinus tenderness and no frontal sinus tenderness. Left sinus exhibits no maxillary sinus tenderness and no frontal sinus tenderness.  Mouth/Throat: Uvula is midline and mucous membranes are normal. Posterior oropharyngeal edema and posterior oropharyngeal erythema present. No oropharyngeal exudate or tonsillar abscesses.  Eyes: Conjunctivae and EOM are normal.  Neck: Neck supple. No thyromegaly present.  Cardiovascular: Normal rate, regular rhythm, normal heart sounds and intact distal pulses.   No murmur heard. Pulmonary/Chest: Effort normal and breath sounds normal. No respiratory distress. She has no wheezes.  Musculoskeletal: Normal range of motion. She exhibits no edema or tenderness.  Lymphadenopathy:    She has no cervical adenopathy.  Neurological: She is alert and oriented to person, place, and time. Coordination normal.  Skin:  Skin is warm and dry. No rash noted. She is not diaphoretic.  Psychiatric: She has a normal mood and affect. Her behavior is normal.  Vitals reviewed.   Results for orders placed or performed in visit on 09/30/15  POCT Influenza A/B  Result Value Ref Range   Influenza A, POC Positive (A) Negative   Influenza B, POC Negative Negative  POCT rapid strep A  Result Value Ref Range   Rapid Strep A Screen Negative Negative      Assessment & Plan:       Problem List Items Addressed This Visit    None    Visit Diagnoses    Viral upper respiratory infection    -  Primary    Patient will try conservative measures with Flonase and an antihistamine and Mucinex. If not improved in 3-4 days she will give [place] a call.    Relevant Medications    fluticasone (FLONASE) 50 MCG/ACT nasal spray        Follow up plan: Return if symptoms worsen or fail to improve.  Counseling provided for all of the vaccine components No orders of the defined types were placed in this encounter.    Caryl Pina, MD [facility] 12/19/2015, 6:22 PM

## 2015-12-19 NOTE — Telephone Encounter (Signed)
Appt Made to see Dr. Sabra Heck at 4:15 on 5/12 for Ear Pain

## 2016-02-18 ENCOUNTER — Ambulatory Visit: Payer: BLUE CROSS/BLUE SHIELD | Admitting: Obstetrics and Gynecology

## 2016-02-19 ENCOUNTER — Encounter: Payer: Self-pay | Admitting: Obstetrics and Gynecology

## 2016-02-19 ENCOUNTER — Ambulatory Visit: Payer: BLUE CROSS/BLUE SHIELD | Admitting: Obstetrics and Gynecology

## 2016-02-19 ENCOUNTER — Telehealth: Payer: Self-pay | Admitting: Obstetrics and Gynecology

## 2016-02-19 NOTE — Telephone Encounter (Signed)
Patient did not keep her appointment for today. She called and cancelled her appointment for her AEX without 24 hours notice. She said she will call back to reschedule at a later date. FYI only.

## 2016-03-24 ENCOUNTER — Encounter: Payer: Self-pay | Admitting: Obstetrics and Gynecology

## 2016-03-24 ENCOUNTER — Ambulatory Visit (INDEPENDENT_AMBULATORY_CARE_PROVIDER_SITE_OTHER): Payer: 59 | Admitting: Obstetrics and Gynecology

## 2016-03-24 VITALS — BP 102/60 | HR 76 | Resp 15 | Ht 62.25 in | Wt 256.0 lb

## 2016-03-24 DIAGNOSIS — Z01419 Encounter for gynecological examination (general) (routine) without abnormal findings: Secondary | ICD-10-CM

## 2016-03-24 DIAGNOSIS — Z124 Encounter for screening for malignant neoplasm of cervix: Secondary | ICD-10-CM

## 2016-03-24 DIAGNOSIS — Z0000 Encounter for general adult medical examination without abnormal findings: Secondary | ICD-10-CM | POA: Diagnosis not present

## 2016-03-24 DIAGNOSIS — E663 Overweight: Secondary | ICD-10-CM

## 2016-03-24 DIAGNOSIS — Z30431 Encounter for routine checking of intrauterine contraceptive device: Secondary | ICD-10-CM

## 2016-03-24 DIAGNOSIS — Z1151 Encounter for screening for human papillomavirus (HPV): Secondary | ICD-10-CM | POA: Diagnosis not present

## 2016-03-24 NOTE — Progress Notes (Signed)
40 y.o. G61P4 SingleHispanicF here for annual exam.   Period Duration (Days): irregular with Mirena IUD Period Pattern: (!) Irregular Menstrual Flow: Light Menstrual Control: Thin pad Dysmenorrhea: (!) Mild Dysmenorrhea Symptoms: Headache  Can go months without bleeding. Sexually active, same partner for a couple of years. No pain with intercourse.  She c/o mild GSI with valsalva. She had some urge incontinence, but that has resolved. She wears a small pad daily, leaks a small amount 1 x a week, typically with a sneeze.   Patient's last menstrual period was 03/18/2016.          Sexually active: Yes.    The current method of family planning is IUD.   Mirena replaced in 11/16 Exercising: No.  The patient does not participate in regular exercise at present. Smoker:  no  Health Maintenance: Pap:  08-16-12 WNL NEG HR HPV History of abnormal Pap:  Yes- 15 years ago- repeat PAP normal MMG:  2014 WNL per patient  Colonoscopy: N/A BMD:   N/A TDaP:  unsure Gardasil: N/A   reports that she has never smoked. She has never used smokeless tobacco. She reports that she does not drink alcohol or use drugs.She is an EMT in the ED, likes it.   Past Medical History:  Diagnosis Date  . Anemia     Past Surgical History:  Procedure Laterality Date  . BREAST BIOPSY  2000   benign  . TUBAL LIGATION  2004   postpartum    Current Outpatient Prescriptions  Medication Sig Dispense Refill  . IBUPROFEN PO Take by mouth as needed.    Marland Kitchen levonorgestrel (MIRENA) 20 MCG/24HR IUD 1 Intra Uterine Device (1 each total) by Intrauterine route once. 1 Intra Uterine Device 0   No current facility-administered medications for this visit.     Family History  Problem Relation Age of Onset  . Diabetes Maternal Grandmother   . Congestive Heart Failure Maternal Grandmother   . Depression Mother   . Colon cancer Neg Hx     Review of Systems  Constitutional: Negative.   HENT: Negative.   Eyes: Negative.    Respiratory: Negative.   Cardiovascular: Negative.   Gastrointestinal: Negative.   Endocrine: Negative.   Genitourinary: Negative.   Musculoskeletal: Negative.   Skin: Negative.   Allergic/Immunologic: Negative.   Neurological: Negative.   Psychiatric/Behavioral: Negative.     Exam:   BP 102/60 (BP Location: Right Arm, Patient Position: Sitting, Cuff Size: Normal)   Pulse 76   Resp 15   Ht 5' 2.25" (1.581 m)   Wt 256 lb (116.1 kg)   LMP 03/18/2016   BMI 46.45 kg/m   Weight change: @WEIGHTCHANGE @ Height:   Height: 5' 2.25" (158.1 cm)  Ht Readings from Last 3 Encounters:  03/24/16 5' 2.25" (1.581 m)  12/19/15 5' 3"  (1.6 m)  11/07/15 5' 3"  (1.6 m)    General appearance: alert, cooperative and appears stated age Head: Normocephalic, without obvious abnormality, atraumatic Neck: no adenopathy, supple, symmetrical, trachea midline and thyroid normal to inspection and palpation Lungs: clear to auscultation bilaterally Breasts: normal appearance, no masses or tenderness Heart: regular rate and rhythm Abdomen: soft, non-tender; bowel sounds normal; no masses,  no organomegaly Extremities: extremities normal, atraumatic, no cyanosis or edema Skin: Skin color, texture, turgor normal. No rashes or lesions Lymph nodes: Cervical, supraclavicular, and axillary nodes normal. No abnormal inguinal nodes palpated Neurologic: Grossly normal   Pelvic: External genitalia:  no lesions  Urethra:  normal appearing urethra with no masses, tenderness or lesions              Bartholins and Skenes: normal                 Vagina: normal appearing vagina with normal color and discharge, no lesions. Moderate blood  in the vagina              Cervix: no lesions, IUD strings 4 cm               Bimanual Exam:  Uterus:  normal size, contour, position, consistency, mobility, non-tender              Adnexa: no mass, fullness, tenderness               Rectovaginal: Confirms                Anus:  normal sphincter tone, no lesions  Chaperone was present for exam.  A:  Well Woman with normal exam  Over weight  Mild GSI  P:   Return for fasting labs  Mammogram is due  Pap with hpv  Discussed breast self exam  Discussed calcium and vit D intake  Discussed weight watchers and exercise, given information on weight loss clinics  Information on Kegel exercises

## 2016-03-24 NOTE — Patient Instructions (Signed)
EXERCISE AND DIET:  We recommended that you start or continue a regular exercise program for good health. Regular exercise means any activity that makes your heart beat faster and makes you sweat.  We recommend exercising at least 30 minutes per day at least 3 days a week, preferably 4 or 5.  We also recommend a diet low in fat and sugar.  Inactivity, poor dietary choices and obesity can cause diabetes, heart attack, stroke, and kidney damage, among others.    ALCOHOL AND SMOKING:  Women should limit their alcohol intake to no more than 7 drinks/beers/glasses of wine (combined, not each!) per week. Moderation of alcohol intake to this level decreases your risk of breast cancer and liver damage. And of course, no recreational drugs are part of a healthy lifestyle.  And absolutely no smoking or even second hand smoke. Most people know smoking can cause heart and lung diseases, but did you know it also contributes to weakening of your bones? Aging of your skin?  Yellowing of your teeth and nails?  CALCIUM AND VITAMIN D:  Adequate intake of calcium and Vitamin D are recommended.  The recommendations for exact amounts of these supplements seem to change often, but generally speaking 600 mg of calcium (either carbonate or citrate) and 800 units of Vitamin D per day seems prudent. Certain women may benefit from higher intake of Vitamin D.  If you are among these women, your doctor will have told you during your visit.    PAP SMEARS:  Pap smears, to check for cervical cancer or precancers,  have traditionally been done yearly, although recent scientific advances have shown that most women can have pap smears less often.  However, every woman still should have a physical exam from her gynecologist every year. It will include a breast check, inspection of the vulva and vagina to check for abnormal growths or skin changes, a visual exam of the cervix, and then an exam to evaluate the size and shape of the uterus and  ovaries.  And after 41 years of age, a rectal exam is indicated to check for rectal cancers. We will also provide age appropriate advice regarding health maintenance, like when you should have certain vaccines, screening for sexually transmitted diseases, bone density testing, colonoscopy, mammograms, etc.   MAMMOGRAMS:  All women over 90 years old should have a yearly mammogram. Many facilities now offer a "3D" mammogram, which may cost around $50 extra out of pocket. If possible,  we recommend you accept the option to have the 3D mammogram performed.  It both reduces the number of women who will be called back for extra views which then turn out to be normal, and it is better than the routine mammogram at detecting truly abnormal areas.    COLONOSCOPY:  Colonoscopy to screen for colon cancer is recommended for all women at age 34.  We know, you hate the idea of the prep.  We agree, BUT, having colon cancer and not knowing it is worse!!  Colon cancer so often starts as a polyp that can be seen and removed at colonscopy, which can quite literally save your life!  And if your first colonoscopy is normal and you have no family history of colon cancer, most women don't have to have it again for 10 years.  Once every ten years, you can do something that may end up saving your life, right?  We will be happy to help you get it scheduled when you are ready.  Be sure to check your insurance coverage so you understand how much it will cost.  It may be covered as a preventative service at no cost, but you should check your particular policy.     Kegel Exercises The goal of Kegel exercises is to isolate and exercise your pelvic floor muscles. These muscles act as a hammock that supports the rectum, vagina, small intestine, and uterus. As the muscles weaken, the hammock sags and these organs are displaced from their normal positions. Kegel exercises can strengthen your pelvic floor muscles and help you to improve  bladder and bowel control, improve sexual response, and help reduce many problems and some discomfort during pregnancy. Kegel exercises can be done anywhere and at any time. HOW TO PERFORM KEGEL EXERCISES 1. Locate your pelvic floor muscles. To do this, squeeze (contract) the muscles that you use when you try to stop the flow of urine. You will feel a tightness in the vaginal area (women) and a tight lift in the rectal area (men and women). 2. When you begin, contract your pelvic muscles tight for 2-5 seconds, then relax them for 2-5 seconds. This is one set. Do 4-5 sets with a short pause in between. 3. Contract your pelvic muscles for 8-10 seconds, then relax them for 8-10 seconds. Do 4-5 sets. If you cannot contract your pelvic muscles for 8-10 seconds, try 5-7 seconds and work your way up to 8-10 seconds. Your goal is 4-5 sets of 10 contractions each day. Keep your stomach, buttocks, and legs relaxed during the exercises. Perform sets of both short and long contractions. Vary your positions. Perform these contractions 3-4 times per day. Perform sets while you are:   Lying in bed in the morning.  Standing at lunch.  Sitting in the late afternoon.  Lying in bed at night. You should do 40-50 contractions per day. Do not perform more Kegel exercises per day than recommended. Overexercising can cause muscle fatigue. Continue these exercises for for at least 15-20 weeks or as directed by your caregiver.   This information is not intended to replace advice given to you by your health care provider. Make sure you discuss any questions you have with your health care provider.   Document Released: 07/12/2012 Document Revised: 08/16/2014 Document Reviewed: 07/12/2012 Elsevier Interactive Patient Education [employer].

## 2016-03-26 ENCOUNTER — Other Ambulatory Visit: Payer: 59

## 2016-03-31 LAB — IPS PAP TEST WITH HPV

## 2016-04-05 ENCOUNTER — Other Ambulatory Visit: Payer: 59

## 2016-04-06 ENCOUNTER — Telehealth: Payer: Self-pay | Admitting: Obstetrics and Gynecology

## 2016-04-06 ENCOUNTER — Other Ambulatory Visit: Payer: 59

## 2016-04-06 NOTE — Telephone Encounter (Signed)
Call to patient about a missed lab appointment. LMTCB to reschedule

## 2016-08-05 ENCOUNTER — Encounter: Payer: Self-pay | Admitting: Obstetrics and Gynecology

## 2016-08-14 ENCOUNTER — Ambulatory Visit: Payer: 59 | Admitting: Family Medicine

## 2016-08-19 ENCOUNTER — Encounter: Payer: Self-pay | Admitting: Obstetrics and Gynecology

## 2016-08-19 ENCOUNTER — Telehealth: Payer: Self-pay | Admitting: Obstetrics and Gynecology

## 2016-08-19 ENCOUNTER — Ambulatory Visit (INDEPENDENT_AMBULATORY_CARE_PROVIDER_SITE_OTHER): Payer: 59 | Admitting: Obstetrics and Gynecology

## 2016-08-19 VITALS — BP 108/70 | HR 64 | Resp 15 | Wt 252.0 lb

## 2016-08-19 DIAGNOSIS — Z975 Presence of (intrauterine) contraceptive device: Secondary | ICD-10-CM | POA: Diagnosis not present

## 2016-08-19 DIAGNOSIS — N393 Stress incontinence (female) (male): Secondary | ICD-10-CM | POA: Diagnosis not present

## 2016-08-19 DIAGNOSIS — N921 Excessive and frequent menstruation with irregular cycle: Secondary | ICD-10-CM | POA: Diagnosis not present

## 2016-08-19 DIAGNOSIS — M5489 Other dorsalgia: Secondary | ICD-10-CM | POA: Diagnosis not present

## 2016-08-19 NOTE — Patient Instructions (Addendum)
Musculoskeletal Pain Musculoskeletal pain is muscle and bone aches and pains. This pain can occur in any part of the body. Follow these instructions at home:  Only take medicines for pain, discomfort, or fever as told by your health care provider.  You may continue all activities unless the activities cause more pain. When the pain lessens, slowly resume normal activities. Gradually increase the intensity and duration of the activities or exercise.  During periods of severe pain, bed rest may be helpful. Lie or sit in any position that is comfortable, but get out of bed and walk around at least every several hours.  If directed, put ice on the injured area.  Put ice in a plastic bag.  Place a towel between your skin and the bag.  Leave the ice on for 20 minutes, 2-3 times a day. Contact a health care provider if:  Your pain is getting worse.  Your pain is not relieved with medicines.  You lose function in the area of the pain if the pain is in your arms, legs, or neck. This information is not intended to replace advice given to you by your health care provider. Make sure you discuss any questions you have with your health care provider. Document Released: 07/26/2005 Document Revised: 01/06/2016 Document Reviewed: 03/30/2013 Elsevier Interactive Patient Education  2017 [place] Exercises The goal of Kegel exercises is to isolate and exercise your pelvic floor muscles. These muscles act as a hammock that supports the rectum, vagina, small intestine, and uterus. As the muscles weaken, the hammock sags and these organs are displaced from their normal positions. Kegel exercises can strengthen your pelvic floor muscles and help you to improve bladder and bowel control, improve sexual response, and help reduce many problems and some discomfort during pregnancy. Kegel exercises can be done anywhere and at any time. HOW TO PERFORM KEGEL EXERCISES 1. Locate your pelvic floor  muscles. To do this, squeeze (contract) the muscles that you use when you try to stop the flow of urine. You will feel a tightness in the vaginal area (women) and a tight lift in the rectal area (men and women). 2. When you begin, contract your pelvic muscles tight for 2-5 seconds, then relax them for 2-5 seconds. This is one set. Do 4-5 sets with a short pause in between. 3. Contract your pelvic muscles for 8-10 seconds, then relax them for 8-10 seconds. Do 4-5 sets. If you cannot contract your pelvic muscles for 8-10 seconds, try 5-7 seconds and work your way up to 8-10 seconds. Your goal is 4-5 sets of 10 contractions each day. Keep your stomach, buttocks, and legs relaxed during the exercises. Perform sets of both short and long contractions. Vary your positions. Perform these contractions 3-4 times per day. Perform sets while you are:   Lying in bed in the morning.  Standing at lunch.  Sitting in the late afternoon.  Lying in bed at night. You should do 40-50 contractions per day. Do not perform more Kegel exercises per day than recommended. Overexercising can cause muscle fatigue. Continue these exercises for for at least 15-20 weeks or as directed by your caregiver. This information is not intended to replace advice given to you by your health care provider. Make sure you discuss any questions you have with your health care provider. Document Released: 07/12/2012 Document Revised: 08/16/2014 Document Reviewed: 06/15/2015 Elsevier Interactive Patient Education  2017 [employer].

## 2016-08-19 NOTE — Progress Notes (Signed)
GYNECOLOGY  VISIT   HPI: 41 y.o.   Single  Hispanic  female   [id number] with No LMP recorded. Patient is not currently having periods (Reason: IUD).   here for   Lower back pain; abnormal bleeding. The patient has a Mirena IUD that was placed in 11/16. She doesn't have regular cycles. She has been having irregular bleeding off and on for the last several weeks. This morning was heavier than normal (only in the toilet), wearing a panty liner.  She c/o a 2 week h/o intermittent lower back pain L>R. No abdominal pain. The pain is an aching pain, sharp at times. Worse with lying down or movement. Feels better to sit. Not recently sexually active.   GYNECOLOGIC HISTORY: No LMP recorded. Patient is not currently having periods (Reason: IUD). Contraception:IUD Menopausal hormone therapy: n/a        OB History    Gravida Para Term Preterm AB Living   4 4       4    SAB TAB Ectopic Multiple Live Births                     Patient Active Problem List   Diagnosis Date Noted  . Rectal bleeding 11/07/2015  . Rectal pain 11/07/2015  . IUD (intrauterine device) in place 11/14/2013    Past Medical History:  Diagnosis Date  . Anemia     Past Surgical History:  Procedure Laterality Date  . BREAST BIOPSY  2000   benign  . TUBAL LIGATION  2004   postpartum    Current Outpatient Prescriptions  Medication Sig Dispense Refill  . IBUPROFEN PO Take by mouth as needed.    Marland Kitchen levonorgestrel (MIRENA) 20 MCG/24HR IUD 1 Intra Uterine Device (1 each total) by Intrauterine route once. 1 Intra Uterine Device 0   No current facility-administered medications for this visit.      ALLERGIES: Patient has no known allergies.  Family History  Problem Relation Age of Onset  . Diabetes Maternal Grandmother   . Congestive Heart Failure Maternal Grandmother   . Depression Mother   . Colon cancer Neg Hx     Social History   Social History  . Marital status: Single    Spouse name: N/A  . Number of  children: 4  . Years of education: N/A   Occupational History  . works in Programmer, applications and as an Veterinary surgeon at [employer]  . Smoking status: Never Smoker  . Smokeless tobacco: Never Used  . Alcohol use No     Comment: occ wine  . Drug use: No  . Sexual activity: Yes    Partners: Male    Birth control/ protection: IUD   Other Topics Concern  . Not on file   Social History Narrative  . No narrative on file    Review of Systems  Constitutional: Negative.   HENT: Negative.   Eyes: Negative.   Respiratory: Negative.   Cardiovascular: Negative.   Gastrointestinal: Negative.   Genitourinary:       Painful periods Menstrual cycle changes Unscheduled bleeding or spotting  Musculoskeletal: Negative.   Skin: Negative.   Neurological: Negative.   Endo/Heme/Allergies: Negative.   Psychiatric/Behavioral: Negative.     PHYSICAL EXAMINATION:    BP 108/70 (BP Location: Right Arm, Patient Position: Sitting, Cuff Size: Normal)   Pulse 64   Resp 15   Wt 252 lb (114.3 kg)   BMI 45.72 kg/m  General appearance: alert, cooperative and appears stated age Abdomen: soft, non-tender; bowel sounds normal; no masses,  no organomegaly Back: tender to palpation in the left mid to lower back  Pelvic: External genitalia:  no lesions              Urethra:  normal appearing urethra with no masses, tenderness or lesions              Bartholins and Skenes: normal                 Vagina: normal appearing vagina with normal color and discharge, no lesions              Cervix: no lesions and IUD string 3 cm (use long peterson speculum)              Bimanual Exam:  Uterus:  normal size, contour, position, consistency, mobility, non-tender              Adnexa: no mass, fullness, tenderness                Chaperone was present for exam.  ASSESSMENT Back pain, c/w musculoskeletal etiology. She has a URI and has been coughing Bleeding with the IUD,  normal exam  GSI, worse now with her cold  PLAN Take ibuprofen as needed Offered Flexeril, she declined Discussed heat and cold for her back Kegel exercises, information given She will call if her bleeding doesn't stop in the next 1-2 weeks (tolerable)   An After Visit Summary was printed and given to the patient.  15 minutes face to face time of which over 50% was spent in counseling.

## 2016-08-19 NOTE — Telephone Encounter (Signed)
Spoke with patient. Patient has a Mirena IUD in place. Reports last week she was no feeling well and began to have lower back pain. Felt this was related to illness. Is no longer feeling ill, but is still having lower back pain. Pain is dull and cramping. Patient started her menses today and it is heavier than normal. Reports she has a cycle about every 3 months with her IUD. Had spotting 3 weeks ago. Denies any sharp pain, urinary symptoms, fever, or chills. "This is just not normal for me." Advised she will need to be seen in the office for further evaluation. Patient is agreeable. Appointment scheduled for today at 10:30 am with Dr.Jertson. Also requesting fasting lab work to be preformed. Has not eaten since before midnight on 1/10.  Routing to provider for final review. Patient agreeable to disposition. Will close encounter.

## 2016-08-19 NOTE — Telephone Encounter (Signed)
Patient is having some lower back pain and abnormal bleeding. Patient would like an appointment.

## 2016-08-24 ENCOUNTER — Telehealth: Payer: Self-pay | Admitting: Family

## 2016-08-24 NOTE — Telephone Encounter (Signed)
Left detailed message for pt to try OTC or call for appt

## 2016-11-09 DIAGNOSIS — Z1231 Encounter for screening mammogram for malignant neoplasm of breast: Secondary | ICD-10-CM | POA: Diagnosis not present

## 2016-11-26 ENCOUNTER — Encounter: Payer: Self-pay | Admitting: Family Medicine

## 2016-11-26 ENCOUNTER — Ambulatory Visit (INDEPENDENT_AMBULATORY_CARE_PROVIDER_SITE_OTHER): Payer: 59 | Admitting: Family Medicine

## 2016-11-26 VITALS — BP 120/89 | HR 90 | Temp 97.3°F | Ht 62.25 in | Wt 251.6 lb

## 2016-11-26 DIAGNOSIS — J0110 Acute frontal sinusitis, unspecified: Secondary | ICD-10-CM | POA: Diagnosis not present

## 2016-11-26 MED ORDER — AMOXICILLIN-POT CLAVULANATE 875-125 MG PO TABS: 1.0000 | ORAL_TABLET | Freq: Two times a day (BID) | ORAL | 0 refills | Status: DC

## 2016-11-26 MED ORDER — FLUTICASONE PROPIONATE 50 MCG/ACT NA SUSP: 2.0000 | Freq: Every day | NASAL | 11 refills | Status: DC

## 2016-11-26 NOTE — Progress Notes (Signed)
   HPI  Patient presents today here with acute illness.  Patient complains of 10-14 days of sinus pain and pressure. Over the last week she's had cough, congestion, and sore throat.  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  She denies shortness of breath, chest pain, fevers, chills, sweats.  She's tolerating foods and fluids normally.    PMH: Smoking status noted ROS: Per HPI  Objective: BP 120/89   Pulse 90   Temp 97.3 F (36.3 C) (Oral)   Ht 5' 2.25" (1.581 m)   Wt 251 lb 9.6 oz (114.1 kg)   BMI 45.65 kg/m  Gen: NAD, alert, cooperative with exam HEENT: NCAT, oropharynx clear, nares with swollen turbinates boggy mucosa, TMs normal bilaterally, positive facial pain and pressure with palpation of the frontal sinuses CV: RRR, good S1/S2, no murmur Resp: CTABL, no wheezes, non-labored Ext: No edema, warm Neuro: Alert and oriented, No gross deficits  Assessment and plan:  # Frontal sinusitis Treat with Augmentin Start Flonase, add daily antihistamine if needed Return to clinic with any concerns or worsening symptoms.   Meds ordered this encounter  Medications  . fluticasone (FLONASE) 50 MCG/ACT nasal spray    Sig: Place 2 sprays into both nostrils daily.    Dispense:  16 g    Refill:  11  . amoxicillin-clavulanate (AUGMENTIN) 875-125 MG tablet    Sig: Take 1 tablet by mouth 2 (two) times daily.    Dispense:  20 tablet    Refill:  0    Laroy Apple, MD [facility] 11/26/2016, 6:27 PM

## 2016-11-26 NOTE — Patient Instructions (Signed)
Great to meet you!  Start augmentin today  Consider the flonase for allergic rhinitis symptoms.    Sinusitis, Adult Sinusitis is soreness and inflammation of your sinuses. Sinuses are hollow spaces in the bones around your face. They are located:  Around your eyes.  In the middle of your forehead.  Behind your nose.  In your cheekbones. Your sinuses and nasal passages are lined with a stringy fluid (mucus). Mucus normally drains out of your sinuses. When your nasal tissues get inflamed or swollen, the mucus can get trapped or blocked so air cannot flow through your sinuses. This lets bacteria, viruses, and funguses grow, and that leads to infection. Follow these instructions at home: Medicines   Take, use, or apply over-the-counter and prescription medicines only as told by your doctor. These may include nasal sprays.  If you were prescribed an antibiotic medicine, take it as told by your doctor. Do not stop taking the antibiotic even if you start to feel better. Hydrate and Humidify   Drink enough water to keep your pee (urine) clear or pale yellow.  Use a cool mist humidifier to keep the humidity level in your home above 50%.  Breathe in steam for 10-15 minutes, 3-4 times a day or as told by your doctor. You can do this in the bathroom while a hot shower is running.  Try not to spend time in cool or dry air. Rest   Rest as much as possible.  Sleep with your head raised (elevated).  Make sure to get enough sleep each night. General instructions   Put a warm, moist washcloth on your face 3-4 times a day or as told by your doctor. This will help with discomfort.  Wash your hands often with soap and water. If there is no soap and water, use hand sanitizer.  Do not smoke. Avoid being around people who are smoking (secondhand smoke).  Keep all follow-up visits as told by your doctor. This is important. Contact a doctor if:  You have a fever.  Your symptoms get  worse.  Your symptoms do not get better within 10 days. Get help right away if:  You have a very bad headache.  You cannot stop throwing up (vomiting).  You have pain or swelling around your face or eyes.  You have trouble seeing.  You feel confused.  Your neck is stiff.  You have trouble breathing. This information is not intended to replace advice given to you by your health care provider. Make sure you discuss any questions you have with your health care provider. Document Released: 01/12/2008 Document Revised: 03/21/2016 Document Reviewed: 05/21/2015 Elsevier Interactive Patient Education  2017 [employer].

## 2017-01-19 DIAGNOSIS — L309 Dermatitis, unspecified: Secondary | ICD-10-CM | POA: Diagnosis not present

## 2017-01-19 DIAGNOSIS — D229 Melanocytic nevi, unspecified: Secondary | ICD-10-CM | POA: Diagnosis not present

## 2017-01-19 DIAGNOSIS — L728 Other follicular cysts of the skin and subcutaneous tissue: Secondary | ICD-10-CM | POA: Diagnosis not present

## 2017-02-03 ENCOUNTER — Encounter: Payer: Self-pay | Admitting: Obstetrics and Gynecology

## 2017-03-08 ENCOUNTER — Telehealth: Payer: Self-pay | Admitting: Family

## 2017-03-08 NOTE — Telephone Encounter (Signed)
NTBS or do EVisit

## 2017-03-08 NOTE — Telephone Encounter (Signed)
Pt notified of recommendation 

## 2017-03-08 NOTE — Telephone Encounter (Signed)
Please advise if patient would need to schedule an appointment.

## 2017-03-09 ENCOUNTER — Ambulatory Visit (INDEPENDENT_AMBULATORY_CARE_PROVIDER_SITE_OTHER): Payer: 59 | Admitting: Family Medicine

## 2017-03-09 ENCOUNTER — Encounter: Payer: Self-pay | Admitting: Family Medicine

## 2017-03-09 VITALS — BP 136/66 | HR 82 | Temp 98.5°F | Ht 62.0 in | Wt 256.0 lb

## 2017-03-09 DIAGNOSIS — L237 Allergic contact dermatitis due to plants, except food: Secondary | ICD-10-CM

## 2017-03-09 MED ORDER — PREDNISONE 20 MG PO TABS: ORAL_TABLET | ORAL | 0 refills | Status: DC

## 2017-03-09 NOTE — Progress Notes (Signed)
S: 41 year old female who was exposed to poison ivy about one week ago and broke out with the rash the day afterwards. She has used some OTC lotions. She is concerned that it will spread to her face. I explained that the rash does not spread from the rash but only from the chemical when it is on the hands.  O: Rash around wrists and forearms consistent with contact dermatitis presumably poison ivy.  A,P: We discussed forms of treatment. Rash is been present about one week so it's natural history would be to last another week approximately. Offered her injection versus pills and she chose the latter so will use a tapered dose of prednisone as so; 60,60, 40, 40, 20, 20 mg on consecutive days

## 2017-03-31 ENCOUNTER — Encounter: Payer: Self-pay | Admitting: Obstetrics and Gynecology

## 2017-03-31 ENCOUNTER — Ambulatory Visit (INDEPENDENT_AMBULATORY_CARE_PROVIDER_SITE_OTHER): Payer: 59 | Admitting: Obstetrics and Gynecology

## 2017-03-31 VITALS — BP 128/80 | HR 72 | Resp 16 | Ht 62.0 in | Wt 252.0 lb

## 2017-03-31 DIAGNOSIS — Z0000 Encounter for general adult medical examination without abnormal findings: Secondary | ICD-10-CM

## 2017-03-31 DIAGNOSIS — Z01419 Encounter for gynecological examination (general) (routine) without abnormal findings: Secondary | ICD-10-CM

## 2017-03-31 DIAGNOSIS — Z30431 Encounter for routine checking of intrauterine contraceptive device: Secondary | ICD-10-CM | POA: Diagnosis not present

## 2017-03-31 DIAGNOSIS — E663 Overweight: Secondary | ICD-10-CM

## 2017-03-31 NOTE — Progress Notes (Signed)
41 y.o. G4P4 SingleHispanicF here for annual exam.  The patient has a mirena IUD, placed in 11/16. Light bleeding every 1-2 months for 4 days. Sexually active, no pain. Same partner x 1 year.  She c/o pain in both hips with different movements, not necessarily with walking. Her feet hurt when she has been standing for a while or when she first starts moving.  She is considering a breast reduction, has pain in her neck and upper back, feels her posture is bad because her breasts are so heavy (triple D).  She reports occasional issues with anal fissures, had one recently, currently fine.   Period Duration (Days): 4 days of spotting  Period Pattern: (!) Irregular Menstrual Flow: Light Menstrual Control: Thin pad Dysmenorrhea: None  Patient's last menstrual period was 03/13/2017.          Sexually active: Yes.    The current method of family planning is IUD.    Exercising: Yes.    walking Smoker:  no  Health Maintenance: Pap:  03-24-16 WNL NEG HR HPV 08-16-12 WNL NEG HR HPV History of abnormal Pap:  Yes 15 + yrs ago MMG:  08/2016 WNL Per patient  Colonoscopy:  Never BMD:   Never TDaP: 2013   Gardasil: N/A   reports that she has never smoked. She has never used smokeless tobacco. 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 Kids are 22 through 68. Oldest daughter just got married. 8th grader and senior at home now.   Past Medical History:  Diagnosis Date  . Anemia     Past Surgical History:  Procedure Laterality Date  . BREAST BIOPSY  2000   benign  . TUBAL LIGATION  2004   postpartum    Current Outpatient Prescriptions  Medication Sig Dispense Refill  . IBUPROFEN PO Take by mouth as needed.    Marland Kitchen levonorgestrel (MIRENA) 20 MCG/24HR IUD 1 Intra Uterine Device (1 each total) by Intrauterine route once. 1 Intra Uterine Device 0   No current facility-administered medications  for this visit.     Family History  Problem Relation Age of Onset  . Diabetes Maternal Grandmother   . Congestive Heart Failure Maternal Grandmother   . Depression Mother   . Colon cancer Neg Hx     Review of Systems  Constitutional: Negative.   HENT: Negative.   Eyes: Negative.   Respiratory: Negative.   Cardiovascular: Negative.   Gastrointestinal: Negative.   Endocrine: Negative.   Genitourinary: Negative.   Musculoskeletal:       Pain in both feet   Skin: Negative.   Allergic/Immunologic: Negative.   Neurological: Negative.   Psychiatric/Behavioral: Negative.     Exam:   BP 128/80 (BP Location: Right Arm, Patient Position: Sitting, Cuff Size: Normal)   Pulse 72   Resp 16   Ht 5\' 2"  (1.575 m)   Wt 252 lb (114.3 kg)   LMP 03/13/2017   BMI 46.09 kg/m   Weight change: @WEIGHTCHANGE @ Height:   Height: 5\' 2"  (157.5 cm)  Ht Readings from Last 3 Encounters:  03/31/17 5\' 2"  (1.575 m)  03/09/17 5\' 2"  (1.575 m)  11/26/16 5' 2.25" (1.581 m)    General appearance: alert, cooperative and appears stated age Head: Normocephalic, without obvious abnormality, atraumatic Neck: no adenopathy, supple, symmetrical, trachea midline and thyroid normal to inspection and palpation Lungs: clear to  auscultation bilaterally Cardiovascular: regular rate and rhythm Breasts: normal appearance, no masses or tenderness Abdomen: soft, non-tender; bowel sounds normal; no masses,  no organomegaly Extremities: extremities normal, atraumatic, no cyanosis or edema Skin: Skin color, texture, turgor normal. No rashes or lesions Lymph nodes: Cervical, supraclavicular, and axillary nodes normal. No abnormal inguinal nodes palpated Neurologic: Grossly normal   Pelvic: External genitalia:  no lesions              Urethra:  normal appearing urethra with no masses, tenderness or lesions              Bartholins and Skenes: normal                 Vagina: normal appearing vagina with normal color and  discharge, no lesions              Cervix: no lesions and IUD string 3 cm (use long speculum)               Bimanual Exam:  Uterus:  normal size, contour, position, consistency, mobility, non-tender and anteverted              Adnexa: no mass, fullness, tenderness               Rectovaginal: Confirms               Anus:  normal sphincter tone, no lesions  Chaperone was present for exam.  A:  Well Woman with normal exam  IUD check  Hip and foot pain  Overweight  P:   No pap this year  Screening labs  Recommended she f/u with her primary for her hip pain, weight loss would likely help  Discussed breast self exam  Discussed calcium and vit D intake, minimal intake  Mammogram is UTD

## 2017-03-31 NOTE — Patient Instructions (Addendum)
EXERCISE AND DIET:  We recommended that you start or continue a regular exercise program for good health. Regular exercise means any activity that makes your heart beat faster and makes you sweat.  We recommend exercising at least 30 minutes per day at least 3 days a week, preferably 4 or 5.  We also recommend a diet low in fat and sugar.  Inactivity, poor dietary choices and obesity can cause diabetes, heart attack, stroke, and kidney damage, among others.    ALCOHOL AND SMOKING:  Women should limit their alcohol intake to no more than 7 drinks/beers/glasses of wine (combined, not each!) per week. Moderation of alcohol intake to this level decreases your risk of breast cancer and liver damage. And of course, no recreational drugs are part of a healthy lifestyle.  And absolutely no smoking or even second hand smoke. Most people know smoking can cause heart and lung diseases, but did you know it also contributes to weakening of your bones? Aging of your skin?  Yellowing of your teeth and nails?  CALCIUM AND VITAMIN D:  Adequate intake of calcium and Vitamin D are recommended.  The recommendations for exact amounts of these supplements seem to change often, but generally speaking 600 mg of calcium (either carbonate or citrate) and 800 units of Vitamin D per day seems prudent. Certain women may benefit from higher intake of Vitamin D.  If you are among these women, your doctor will have told you during your visit.    PAP SMEARS:  Pap smears, to check for cervical cancer or precancers,  have traditionally been done yearly, although recent scientific advances have shown that most women can have pap smears less often.  However, every woman still should have a physical exam from her gynecologist every year. It will include a breast check, inspection of the vulva and vagina to check for abnormal growths or skin changes, a visual exam of the cervix, and then an exam to evaluate the size  Breast Self-Awareness Breast  self-awareness means being familiar with how your breasts look and feel. It involves checking your breasts regularly and reporting any changes to your health care provider. Practicing breast self-awareness is important. A change in your breasts can be a sign of a serious medical problem. Being familiar with how your breasts look and feel allows you to find any problems early, when treatment is more likely to be successful. All women should practice breast self-awareness, including women who have had breast implants. How to do a breast self-exam One way to learn what is normal for your breasts and whether your breasts are changing is to do a breast self-exam. To do a breast self-exam: Look for Changes  1. Remove all the clothing above your waist. 2. Stand in front of a mirror in a room with good lighting. 3. Put your hands on your hips. 4. Push your hands firmly downward. 5. Compare your breasts in the mirror. Look for differences between them (asymmetry), such as: ? Differences in shape. ? Differences in size. ? Puckers, dips, and bumps in one breast and not the other. 6. Look at each breast for changes in your skin, such as: ? Redness. ? Scaly areas. 7. Look for changes in your nipples, such as: ? Discharge. ? Bleeding. ? Dimpling. ? Redness. ? A change in position. Feel for Changes  Carefully feel your breasts for lumps and changes. It is best to do this while lying on your back on the floor and again while  sitting or standing in the shower or tub with soapy water on your skin. Feel each breast in the following way:  Place the arm on the side of the breast you are examining above your head.  Feel your breast with the other hand.  Start in the nipple area and make  inch (2 cm) overlapping circles to feel your breast. Use the pads of your three middle fingers to do this. Apply light pressure, then medium pressure, then firm pressure. The light pressure will allow you to feel the  tissue closest to the skin. The medium pressure will allow you to feel the tissue that is a little deeper. The firm pressure will allow you to feel the tissue close to the ribs.  Continue the overlapping circles, moving downward over the breast until you feel your ribs below your breast.  Move one finger-width toward the center of the body. Continue to use the  inch (2 cm) overlapping circles to feel your breast as you move slowly up toward your collarbone.  Continue the up and down exam using all three pressures until you reach your armpit.  Write Down What You Find  Write down what is normal for each breast and any changes that you find. Keep a written record with breast changes or normal findings for each breast. By writing this information down, you do not need to depend only on memory for size, tenderness, or location. Write down where you are in your menstrual cycle, if you are still menstruating. If you are having trouble noticing differences in your breasts, do not get discouraged. With time you will become more familiar with the variations in your breasts and more comfortable with the exam. How often should I examine my breasts? Examine your breasts every month. If you are breastfeeding, the best time to examine your breasts is after a feeding or after using a breast pump. If you menstruate, the best time to examine your breasts is 5-7 days after your period is over. During your period, your breasts are lumpier, and it may be more difficult to notice changes. When should I see my health care provider? See your health care provider if you notice:  A change in shape or size of your breasts or nipples.  A change in the skin of your breast or nipples, such as a reddened or scaly area.  Unusual discharge from your nipples.  A lump or thick area that was not there before.  Pain in your breasts.  Anything that concerns you.  This information is not intended to replace advice given to  you by your health care provider. Make sure you discuss any questions you have with your health care provider. Document Released: 07/26/2005 Document Revised: 01/01/2016 Document Reviewed: 06/15/2015 Elsevier Interactive Patient Education  2018 [employer]. and shape of the uterus and ovaries.  And after 41 years of age, a rectal exam is indicated to check for rectal cancers. We will also provide age appropriate advice regarding health maintenance, like when you should have certain vaccines, screening for sexually transmitted diseases, bone density testing, colonoscopy, mammograms, etc.   MAMMOGRAMS:  All women over 28 years old should have a yearly mammogram. Many facilities now offer a "3D" mammogram, which may cost around $50 extra out of pocket. If possible,  we recommend you accept the option to have the 3D mammogram performed.  It both reduces the number of women who will be called back for extra views which then turn out to  be normal, and it is better than the routine mammogram at detecting truly abnormal areas.    COLONOSCOPY:  Colonoscopy to screen for colon cancer is recommended for all women at age 63.  We know, you hate the idea of the prep.  We agree, BUT, having colon cancer and not knowing it is worse!!  Colon cancer so often starts as a polyp that can be seen and removed at colonscopy, which can quite literally save your life!  And if your first colonoscopy is normal and you have no family history of colon cancer, most women don't have to have it again for 10 years.  Once every ten years, you can do something that may end up saving your life, right?  We will be happy to help you get it scheduled when you are ready.  Be sure to check your insurance coverage so you understand how much it will cost.  It may be covered as a preventative service at no cost, but you should check your particular policy.

## 2017-04-01 ENCOUNTER — Other Ambulatory Visit: Payer: Self-pay | Admitting: Obstetrics and Gynecology

## 2017-04-01 DIAGNOSIS — E559 Vitamin D deficiency, unspecified: Secondary | ICD-10-CM

## 2017-04-01 LAB — LIPID PANEL
CHOL/HDL RATIO: 3.5 ratio (ref 0.0–4.4)
CHOLESTEROL TOTAL: 157 mg/dL (ref 100–199)
HDL: 45 mg/dL (ref >39)
LDL Calculated: 87 mg/dL (ref 0–99)
TRIGLYCERIDES: 123 mg/dL (ref 0–149)
VLDL Cholesterol Cal: 25 mg/dL (ref 5–40)

## 2017-04-01 LAB — COMPREHENSIVE METABOLIC PANEL
ALK PHOS: 75 IU/L (ref 39–117)
ALT: 15 IU/L (ref 0–32)
AST: 16 IU/L (ref 0–40)
Albumin/Globulin Ratio: 1.7 (ref 1.2–2.2)
Albumin: 4.2 g/dL (ref 3.5–5.5)
BUN/Creatinine Ratio: 20 (ref 9–23)
BUN: 15 mg/dL (ref 6–24)
Bilirubin Total: 0.4 mg/dL (ref 0.0–1.2)
CHLORIDE: 101 mmol/L (ref 96–106)
CO2: 24 mmol/L (ref 20–29)
Calcium: 9.3 mg/dL (ref 8.7–10.2)
Creatinine, Ser: 0.75 mg/dL (ref 0.57–1.00)
GFR calc Af Amer: 114 mL/min/{1.73_m2} (ref >59)
GFR calc non Af Amer: 99 mL/min/{1.73_m2} (ref >59)
GLUCOSE: 87 mg/dL (ref 65–99)
Globulin, Total: 2.5 g/dL (ref 1.5–4.5)
POTASSIUM: 4 mmol/L (ref 3.5–5.2)
Sodium: 139 mmol/L (ref 134–144)
Total Protein: 6.7 g/dL (ref 6.0–8.5)

## 2017-04-01 LAB — CBC
HEMATOCRIT: 41.8 % (ref 34.0–46.6)
HEMOGLOBIN: 13.3 g/dL (ref 11.1–15.9)
MCH: 28.4 pg (ref 26.6–33.0)
MCHC: 31.8 g/dL (ref 31.5–35.7)
MCV: 89 fL (ref 79–97)
Platelets: 292 10*3/uL (ref 150–379)
RBC: 4.68 x10E6/uL (ref 3.77–5.28)
RDW: 14.3 % (ref 12.3–15.4)
WBC: 9.2 10*3/uL (ref 3.4–10.8)

## 2017-04-01 LAB — HEMOGLOBIN A1C
Est. average glucose Bld gHb Est-mCnc: 117 mg/dL
HEMOGLOBIN A1C: 5.7 % — AB (ref 4.8–5.6)

## 2017-04-01 LAB — TSH: TSH: 4.46 u[IU]/mL (ref 0.450–4.500)

## 2017-04-01 LAB — VITAMIN D 25 HYDROXY (VIT D DEFICIENCY, FRACTURES): VIT D 25 HYDROXY: 16 ng/mL — AB (ref 30.0–100.0)

## 2017-08-16 ENCOUNTER — Encounter: Payer: Self-pay | Admitting: Family Medicine

## 2017-08-16 ENCOUNTER — Ambulatory Visit (INDEPENDENT_AMBULATORY_CARE_PROVIDER_SITE_OTHER): Payer: 59 | Admitting: Family Medicine

## 2017-08-16 VITALS — BP 109/78 | HR 78 | Temp 97.8°F | Ht 62.0 in | Wt 262.0 lb

## 2017-08-16 DIAGNOSIS — M2142 Flat foot [pes planus] (acquired), left foot: Secondary | ICD-10-CM

## 2017-08-16 DIAGNOSIS — M2141 Flat foot [pes planus] (acquired), right foot: Secondary | ICD-10-CM | POA: Diagnosis not present

## 2017-08-16 DIAGNOSIS — E559 Vitamin D deficiency, unspecified: Secondary | ICD-10-CM

## 2017-08-16 DIAGNOSIS — M722 Plantar fascial fibromatosis: Secondary | ICD-10-CM

## 2017-08-16 NOTE — Progress Notes (Signed)
Subjective: CC: Foot pain PCP: Sharion Balloon, FNP Linda Shepherd is a 42 y.o. female presenting to clinic today for:  1.  Foot pain Patient reports bilateral heel and foot pain for the last several months.  She notes that left seems to be worse than the right.  She notes that pain is significant when she wakes up each morning and goes to step down on the floor.  However, she reports that pain seems to be worse with prolonged standing.  She has changed shoes, purchased several different orthotics, done frequent foot soaks and massages with her pedicures with little improvement in symptoms.  She has not taken any oral analgesics or anti-inflammatories for this.  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  She reports initially she was using Crocs as her footwear but changed to tennis shoes with the onset of foot pain.  Prior to this she wore high heels mostly.  She does report some burning in the heels but no numbness or tingling.  No joint swelling.  She is interested in potentially seeing a specialist but is not ready for surgical interventions at this time.  2.  Vitamin D deficiency Patient reports she was diagnosed with vitamin D deficiency by her OB/GYN.  She is status post completion of vitamin D replacement.  She is due for a vitamin D recheck tomorrow and is wondering if we can obtain the lab here today.  ROS: Per HPI  No Known Allergies Past Medical History:  Diagnosis Date  . Anemia     Current Outpatient Medications:  .  IBUPROFEN PO, Take by mouth as needed., Disp: , Rfl:  .  levonorgestrel (MIRENA) 20 MCG/24HR IUD, 1 Intra Uterine Device (1 each total) by Intrauterine route once., Disp: 1 Intra Uterine Device, Rfl: 0 Social History   Socioeconomic History  . Marital status: Single    Spouse name: Not on file  . Number of children: 4  . Years of  education: Not on file  . Highest education level: Not on file  Social Needs  . Financial resource strain: Not on file  . Food insecurity - worry: Not on file  . Food insecurity - inability: Not on file  . Transportation needs - medical: Not on file  . Transportation needs - non-medical: Not on file  Occupational History  . Occupation: works in Programmer, applications and as an Veterinary surgeon at [employer] Use  . Smoking status: Never Smoker  . Smokeless tobacco: Never Used  Substance and Sexual Activity  . Alcohol use: No    Comment: occ wine  . Drug use: No  . Sexual activity: Not Currently    Partners: Male    Birth control/protection: IUD  Other Topics Concern  . Not on file  Social History Narrative  . Not on file   Family History  Problem Relation Age of Onset  . Diabetes Maternal Grandmother   . Congestive Heart Failure Maternal Grandmother   . Depression Mother   . Colon cancer Neg Hx     Objective: Office vital signs reviewed. BP 109/78   Pulse 78   Temp 97.8 F (36.6 C) (Oral)   Ht 5\' 2"  (1.575 m)   Wt 262 lb (118.8 kg)   BMI 47.92 kg/m   Physical Examination:  General: Awake, alert, obese, No acute distress MSK: Antalgic gait.  Walking mostly on  the forefoot.  Some splaying of toes of bilateral feet appreciated.  Longitudinal arch collapse appreciated bilaterally.   No palpable bony abnormalities.  No gross deformities appreciated.  No joint swelling.  She does have marketed tenderness to palpation along the plantar medial aspect of the calcaneus bilaterally.  Left greater than right. Neuro: Light touch sensation grossly intact. Skin: No ecchymosis, erythema or skin breakdown.  Assessment/ Plan: 42 y.o. female   1. Bilateral plantar fasciitis Exam clinically consistent with plantar fasciitis.  I did offer imaging studies of bilateral heels to evaluate for associated spurring but patient would not want to have surgical intervention at this time and  therefore x-rays were not obtained.  I did recommend oral analgesics, anti-inflammatories.  I offered to prescribe an anti-inflammatory but patient wished to use an OTC.  Motrin 600 mg p.o. every 8 hours recommended.  Take with food and plenty of water.  Home care instructions, including stretching was reviewed with the patient.  Handout provided.  She has been referred to sports medicine for consideration of orthotics and possibly for corticosteroid injection into the plantar fascia if conservative treatment is not helpful. - Ambulatory referral to Sports Medicine  2. Pes planus of both feet Would benefit from orthotics and more supportive footwear.  Weight loss.  3. Vitamin D deficiency We will check vitamin D level.  Will contact patient with results. - VITAMIN D 25 Hydroxy (Vit-D Deficiency, Fractures)   Orders Placed This Encounter  Procedures  . VITAMIN D 25 Hydroxy (Vit-D Deficiency, Fractures)  . Ambulatory referral to Sports Medicine    Referral Priority:   Routine    Referral Type:   Consultation    Number of Visits Requested:   [place], [place] [phone]

## 2017-08-16 NOTE — Patient Instructions (Addendum)
I value your feedback and appreciate you entrusting [place] with your care.  If you get a survey, I would appreciate your taking the time to let us know what your experience was like.  As we discussed, I do think this is a plantar fasciitis picture.  You may use ibuprofen 600 mg every 8 hours as needed pain and inflammation.  Make sure that you take this with a meal and plenty of water.  We discussed the options of referral to sports medicine versus podiatry.  Because you are not ready for surgical intervention, I have sent you over to sports medicine for orthotics and consideration for steroid injection into the plantar fascia if the conservative treatments that we have discussed today are not helping.  Please call me if you do not hear for an appointment within the next week.  Plantar Fasciitis Plantar fasciitis is a painful foot condition that affects the heel. It occurs when the band of tissue that connects the toes to the heel bone (plantar fascia) becomes irritated. This can happen after exercising too much or doing other repetitive activities (overuse injury). The pain from plantar fasciitis can range from mild irritation to severe pain that makes it difficult for you to walk or move. The pain is usually worse in the morning or after you have been sitting or lying down for a while. What are the causes? This condition may be caused by:  Standing for long periods of time.  Wearing shoes that do not fit.  Doing high-impact activities, including running, aerobics, and ballet.  Being overweight.  Having an abnormal way of walking (gait).  Having tight calf muscles.  Having high arches in your feet.  Starting a new athletic activity.  What are the signs or symptoms? The main symptom of this condition is heel pain. Other symptoms include:  Pain that gets worse after activity or exercise.  Pain that is worse in the morning or after resting.  Pain that goes away after you walk for a few  minutes.  How is this diagnosed? This condition may be diagnosed based on your signs and symptoms. Your health care provider will also do a physical exam to check for:  A tender area on the bottom of your foot.  A high arch in your foot.  Pain when you move your foot.  Difficulty moving your foot.  You may also need to have imaging studies to confirm the diagnosis. These can include:  X-rays.  Ultrasound.  MRI.  How is this treated? Treatment for plantar fasciitis depends on the severity of the condition. Your treatment may include:  Rest, ice, and over-the-counter pain medicines to manage your pain.  Exercises to stretch your calves and your plantar fascia.  A splint that holds your foot in a stretched, upward position while you sleep (night splint).  Physical therapy to relieve symptoms and prevent problems in the future.  Cortisone injections to relieve severe pain.  Extracorporeal shock wave therapy (ESWT) to stimulate damaged plantar fascia with electrical impulses. It is often used as a last resort before surgery.  Surgery, if other treatments have not worked after 12 months.  Follow these instructions at home:  Take medicines only as directed by your health care provider.  Avoid activities that cause pain.  Roll the bottom of your foot over a bag of ice or a bottle of cold water. Do this for 20 minutes, 3-4 times a day.  Perform simple stretches as directed by your health care  provider.  Try wearing athletic shoes with air-sole or gel-sole cushions or soft shoe inserts.  Wear a night splint while sleeping, if directed by your health care provider.  Keep all follow-up appointments with your health care provider. How is this prevented?  Do not perform exercises or activities that cause heel pain.  Consider finding low-impact activities if you continue to have problems.  Lose weight if you need to. The best way to prevent plantar fasciitis is to avoid  the activities that aggravate your plantar fascia. Contact a health care provider if:  Your symptoms do not go away after treatment with home care measures.  Your pain gets worse.  Your pain affects your ability to move or do your daily activities. This information is not intended to replace advice given to you by your health care provider. Make sure you discuss any questions you have with your health care provider. Document Released: 04/20/2001 Document Revised: 12/29/2015 Document Reviewed: 06/05/2014 Elsevier Interactive Patient Education  2018 [employer].  Plantar Fasciitis Rehab Ask your health care provider which exercises are safe for you. Do exercises exactly as told by your health care provider and adjust them as directed. It is normal to feel mild stretching, pulling, tightness, or discomfort as you do these exercises, but you should stop right away if you feel sudden pain or your pain gets worse. Do not begin these exercises until told by your health care provider. Stretching and range of motion exercises These exercises warm up your muscles and joints and improve the movement and flexibility of your foot. These exercises also help to relieve pain. Exercise A: Plantar fascia stretch  1. Sit with your left / right leg crossed over your opposite knee. 2. Hold your heel with one hand with that thumb near your arch. With your other hand, hold your toes and gently pull them back toward the top of your foot. You should feel a stretch on the bottom of your toes or your foot or both. 3. Hold this stretch for__________ seconds. 4. Slowly release your toes and return to the starting position. Repeat __________ times. Complete this exercise __________ times a day. Exercise B: Gastroc, standing  1. Stand with your hands against a wall. 2. Extend your left / right leg behind you, and bend your front knee slightly. 3. Keeping your heels on the floor and keeping your back knee straight,  shift your weight toward the wall without arching your back. You should feel a gentle stretch in your left / right calf. 4. Hold this position for __________ seconds. Repeat __________ times. Complete this exercise __________ times a day. Exercise C: Soleus, standing 1. Stand with your hands against a wall. 2. Extend your left / right leg behind you, and bend your front knee slightly. 3. Keeping your heels on the floor, bend your back knee and slightly shift your weight over the back leg. You should feel a gentle stretch deep in your calf. 4. Hold this position for __________ seconds. Repeat __________ times. Complete this exercise __________ times a day. Exercise D: Gastrocsoleus, standing 1. Stand with the ball of your left / right foot on a step. The ball of your foot is on the walking surface, right under your toes. 2. Keep your other foot firmly on the same step. 3. Hold onto the wall or a railing for balance. 4. Slowly lift your other foot, allowing your body weight to press your heel down over the edge of the step. You should  feel a stretch in your left / right calf. 5. Hold this position for __________ seconds. 6. Return both feet to the step. 7. Repeat this exercise with a slight bend in your left / right knee. Repeat __________ times with your left / right knee straight and __________ times with your left / right knee bent. Complete this exercise __________ times a day. Balance exercise This exercise builds your balance and strength control of your arch to help take pressure off your plantar fascia. Exercise E: Single leg stand 1. Without shoes, stand near a railing or in a doorway. You may hold onto the railing or door frame as needed. 2. Stand on your left / right foot. Keep your big toe down on the floor and try to keep your arch lifted. Do not let your foot roll inward. 3. Hold this position for __________ seconds. 4. If this exercise is too easy, you can try it with your eyes  closed or while standing on a pillow. Repeat __________ times. Complete this exercise __________ times a day. This information is not intended to replace advice given to you by your health care provider. Make sure you discuss any questions you have with your health care provider. Document Released: 07/26/2005 Document Revised: 03/30/2016 Document Reviewed: 06/09/2015 Elsevier Interactive Patient Education  2018 [employer].

## 2017-08-17 ENCOUNTER — Other Ambulatory Visit: Payer: Self-pay

## 2017-08-17 ENCOUNTER — Telehealth: Payer: Self-pay | Admitting: Obstetrics and Gynecology

## 2017-08-17 LAB — VITAMIN D 25 HYDROXY (VIT D DEFICIENCY, FRACTURES): Vit D, 25-Hydroxy: 20.3 ng/mL — ABNORMAL LOW (ref 30.0–100.0)

## 2017-08-17 NOTE — Telephone Encounter (Signed)
Patient called and left a message after hours cancelling her appointment on Friday, 08/19/17, for a vitamin D test. She said she had this done at her recent PCP visit.

## 2017-08-19 ENCOUNTER — Other Ambulatory Visit: Payer: Self-pay

## 2017-08-24 ENCOUNTER — Ambulatory Visit: Payer: BLUE CROSS/BLUE SHIELD | Admitting: Family Medicine

## 2017-08-26 ENCOUNTER — Encounter: Payer: Self-pay | Admitting: Family Medicine

## 2017-08-26 ENCOUNTER — Ambulatory Visit: Payer: 59 | Admitting: Family Medicine

## 2017-08-26 DIAGNOSIS — M2142 Flat foot [pes planus] (acquired), left foot: Secondary | ICD-10-CM | POA: Diagnosis not present

## 2017-08-26 DIAGNOSIS — M722 Plantar fascial fibromatosis: Secondary | ICD-10-CM | POA: Diagnosis not present

## 2017-08-26 DIAGNOSIS — M2141 Flat foot [pes planus] (acquired), right foot: Secondary | ICD-10-CM

## 2017-08-26 DIAGNOSIS — M2140 Flat foot [pes planus] (acquired), unspecified foot: Secondary | ICD-10-CM | POA: Insufficient documentation

## 2017-08-26 NOTE — Progress Notes (Signed)
HPI  CC: Bilateral foot pain (L>R) Patient is here to discuss her bilateral foot pain.  She states that she has been diagnosed with plantar fasciitis of both feet.  Pain is been ongoing over the past month.  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  Since that time she has had significant worsening in her pain.  Pain is located along the plantar aspect of her calcaneus bilaterally.  It is worse first thing in the morning and after a long shift on her feet.  She states that she initially had been wearing crocs footwear during the shifts but has recently transitioned to tennis shoes which has helped significantly.  She states that she has been attempting stretching exercises as well as regular icing which is also improved her symptoms however her symptoms persist and can be quite limiting during her shifts.  She denies any trauma, injury, or falls which may have caused this pain.  She denies any feelings of instability.  No weakness, numbness, or paresthesias.  No swelling, erythema, or ecchymosis.  Medications/Interventions Tried: Ibuprofen, ice, stretching, new foot wear  See HPI and/or previous weakness, numbness, paresthesias.  For associated ROS.  Objective: BP 122/84   Ht 5' 2"  (1.575 m)   Wt 257 lb (116.6 kg)   BMI 47.01 kg/m  Gen: NAD, well groomed, a/o x3, normal affect.  CV: Well-perfused. Warm.  Resp: Non-labored.  Neuro: Sensation intact throughout. No gross coordination deficits.  Gait: Nonpathologic posture, unremarkable stride without signs of limp or balance issues. Ankle/Foot, Bilateral: TTP noted at the plantar aspect of the calcaneus bilaterally. No visible erythema, swelling, ecchymosis, or bony deformity. Notable flexible pes planus deformity. Transverse arch grossly absent; No evidence of tibiotalar deviation; Range of motion is full in all directions. Strength is 5/5 in all directions. No  tenderness at the insertion/body/myotendinous junction of the Achilles tendon; No tenderness on posterior aspects of lateral and medial malleolus; Stable lateral and medial ligaments; Talar dome nontender; Mild pain with calcaneal squeeze; No tenderness over the navicular prominence; No tenderness over cuboid; Mild/moderate pain at base of 5th MT; No tenderness at the distal metatarsals; Able to walk 4 steps.   Custom Orthotics: - Patient was fitted for a standard, cushioned, semi-rigid orthotic. - Orthotic was heated and afterward the patient stood on the orthotic blank positioned on the orthotic stand. - The patient was positioned in subtalar neutral position and 10 degrees of ankle dorsiflexion in a weight bearing stance. - After completion of molding, a stable base was applied to the orthotic blank. - The blank was ground to a stable position for weight bearing. - Size: 9 - Base: White EVA - Additional Posting and Padding: none - The patient ambulated in these, and they were very comfortable.   Assessment and plan:  Plantar fasciitis, bilateral Patient is here with signs and symptoms consistent with plantar fasciitis.  Differential diagnosis also includes possible stress reaction along her calcaneus and proximal fifth metatarsal.  Will attempt to treat plantar fasciitis conservatively with low threshold to seek additional imaging if symptoms persist/worsen. -Continue stable foot wear. -Custom orthotics provided today -Continue stretching and icing. -Discussed expectations. -Follow-up 3 weeks.  Next: Close attention should be paid towards patient's calcaneal squeeze testing as well as discomfort along the fifth metatarsal as these symptoms should improve by follow-up.  If they worsen or persist would strongly consider obtaining radiographs, ultrasound, and/or MRI.  Pes planus, flexible See plan above  I spent 30 minutes with this patient. Over 50% of visit was spent in counseling and  coordination of care for problems with bilateral plantar fasciitis  Elberta Leatherwood, MD,MS [place] Fellow 08/26/2017 12:48 PM

## 2017-08-26 NOTE — Assessment & Plan Note (Signed)
See plan above.

## 2017-08-26 NOTE — Assessment & Plan Note (Signed)
Patient is here with signs and symptoms consistent with plantar fasciitis.  Differential diagnosis also includes possible stress reaction along her calcaneus and proximal fifth metatarsal.  Will attempt to treat plantar fasciitis conservatively with low threshold to seek additional imaging if symptoms persist/worsen. -Continue stable foot wear. -Custom orthotics provided today -Continue stretching and icing. -Discussed expectations. -Follow-up 3 weeks.  Next: Close attention should be paid towards patient's calcaneal squeeze testing as well as discomfort along the fifth metatarsal as these symptoms should improve by follow-up.  If they worsen or persist would strongly consider obtaining radiographs, ultrasound, and/or MRI.

## 2017-08-28 NOTE — Progress Notes (Signed)
[facility]: Attending Note: I have reviewed the chart, discussed wit the Sports Medicine Fellow. I agree with assessment and treatment plan as detailed in the [place] note.

## 2017-09-14 ENCOUNTER — Ambulatory Visit: Payer: 59

## 2017-09-15 ENCOUNTER — Telehealth: Payer: Self-pay | Admitting: Family

## 2017-09-15 NOTE — Telephone Encounter (Signed)
Pt leaving to go out of town early tomorrow No appts available Pt declined appt for tomorrow

## 2017-09-19 ENCOUNTER — Ambulatory Visit (INDEPENDENT_AMBULATORY_CARE_PROVIDER_SITE_OTHER): Payer: 59 | Admitting: Nurse Practitioner

## 2017-09-19 ENCOUNTER — Encounter: Payer: Self-pay | Admitting: Nurse Practitioner

## 2017-09-19 VITALS — Temp 97.9°F | Ht 62.0 in | Wt 253.0 lb

## 2017-09-19 DIAGNOSIS — J0100 Acute maxillary sinusitis, unspecified: Secondary | ICD-10-CM | POA: Diagnosis not present

## 2017-09-19 DIAGNOSIS — R05 Cough: Secondary | ICD-10-CM | POA: Diagnosis not present

## 2017-09-19 DIAGNOSIS — R059 Cough, unspecified: Secondary | ICD-10-CM

## 2017-09-19 MED ORDER — AZITHROMYCIN 250 MG PO TABS: ORAL_TABLET | ORAL | 0 refills | Status: DC

## 2017-09-19 NOTE — Progress Notes (Signed)
   Subjective:    Patient ID: Linda Shepherd, female    DOB: 1976/05/26, 42 y.o.   MRN: [id number]  HPI [place] here today with c/o congestion and facial pressure x1 week. Was worse when flying home form [place]. She has been taking ibuprofen.    Review of Systems  Constitutional: Positive for appetite change (decreased) and fever (low grade).  HENT: Positive for congestion, rhinorrhea and sinus pain. Negative for sore throat and trouble swallowing.   Respiratory: Positive for cough (productive and has turned green in the last 2 days).   Cardiovascular: Negative.   Neurological: Negative.  Negative for headaches.  Psychiatric/Behavioral: Negative.   All other systems reviewed and are negative.      Objective:   Physical Exam  Constitutional: She is oriented to person, place, and time. She appears well-developed and well-nourished. She appears distressed (mild).  HENT:  Right Ear: Hearing, tympanic membrane, external ear and ear canal normal.  Left Ear: Hearing, tympanic membrane, external ear and ear canal normal.  Nose: Mucosal edema and rhinorrhea present. Right sinus exhibits maxillary sinus tenderness. Right sinus exhibits no frontal sinus tenderness. Left sinus exhibits maxillary sinus tenderness. Left sinus exhibits no frontal sinus tenderness.  Mouth/Throat: Uvula is midline. Posterior oropharyngeal erythema (mild) present.  Neck: Normal range of motion. Neck supple.  Cardiovascular: Normal rate and regular rhythm.  Pulmonary/Chest: Effort normal and breath sounds normal.  Abdominal: Soft. Bowel sounds are normal.  Neurological: She is alert and oriented to person, place, and time.  Skin: Skin is warm.  Psychiatric: She has a normal mood and affect. Her behavior is normal. Judgment and thought content normal.   Temp 97.9 F (36.6 C) (Oral)   Ht 5\' 2"  (1.575 m)   Wt 253 lb (114.8 kg)   BMI 46.27 kg/m      Assessment & Plan:   1. Acute maxillary sinusitis, recurrence  not specified   2. Cough    Meds ordered this encounter  Medications  . azithromycin (ZITHROMAX Z-PAK) 250 MG tablet    Sig: As directed    Dispense:  6 tablet    Refill:  0    Order Specific Question:   Supervising Provider    Answer:   VINCENT, CAROL L [[id number]]   1. Take meds as prescribed 2. Use a cool mist humidifier especially during the winter months and when heat has been humid. 3. Use saline nose sprays frequently 4. Saline irrigations of the nose can be very helpful if done frequently.  * 4X daily for 1 week*  * Use of a nettie pot can be helpful with this. Follow directions with this* 5. Drink plenty of fluids 6. Keep thermostat turn down low 7.For any cough or congestion  Use plain Mucinex- regular strength or max strength is fine   * Children- consult with Pharmacist for dosing 8. For fever or aces or pains- take tylenol or ibuprofen appropriate for age and weight.  * for fevers greater than 101 orally you may alternate ibuprofen and tylenol every  3 hours.   Mary-Margaret Hassell Done, FNP

## 2017-09-19 NOTE — Patient Instructions (Signed)

## 2017-09-30 ENCOUNTER — Ambulatory Visit: Payer: 59 | Admitting: Family Medicine

## 2017-12-30 ENCOUNTER — Ambulatory Visit (INDEPENDENT_AMBULATORY_CARE_PROVIDER_SITE_OTHER): Payer: 59 | Admitting: Nurse Practitioner

## 2017-12-30 ENCOUNTER — Encounter: Payer: Self-pay | Admitting: Nurse Practitioner

## 2017-12-30 VITALS — BP 118/64 | HR 78 | Temp 98.0°F | Ht 62.0 in | Wt 254.0 lb

## 2017-12-30 DIAGNOSIS — Z0184 Encounter for antibody response examination: Secondary | ICD-10-CM

## 2017-12-30 DIAGNOSIS — Z0000 Encounter for general adult medical examination without abnormal findings: Secondary | ICD-10-CM

## 2017-12-30 DIAGNOSIS — Z23 Encounter for immunization: Secondary | ICD-10-CM

## 2017-12-30 NOTE — Patient Instructions (Signed)

## 2017-12-30 NOTE — Progress Notes (Signed)
   Subjective:    Patient ID: Linda Shepherd, female    DOB: [birth year], 42 y.o.   MRN: [id number]   Chief Complaint: Annual Exam (No pap. 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 She has no chronic medical problems and is on no meds.   Review of Systems  Constitutional: Negative for activity change and appetite change.  HENT: Negative.   Eyes: Negative for pain.  Respiratory: Negative for shortness of breath.   Cardiovascular: Negative for chest pain, palpitations and leg swelling.  Gastrointestinal: Negative for abdominal pain.  Endocrine: Negative for polydipsia.  Genitourinary: Negative.   Skin: Negative for rash.  Neurological: Negative for dizziness, weakness and headaches.  Hematological: Does not bruise/bleed easily.  Psychiatric/Behavioral: Negative.   All other systems reviewed and are negative.      Objective:   Physical Exam  Constitutional: She is oriented to person, place, and time.  HENT:  Head: Normocephalic.  Nose: Nose normal.  Mouth/Throat: Oropharynx is clear and moist.  Eyes: Pupils are equal, round, and reactive to light. EOM are normal.  Neck: Normal range of motion. Neck supple. No JVD present. Carotid bruit is not present.  Cardiovascular: Normal rate, regular rhythm, normal heart sounds and intact distal pulses.  Pulmonary/Chest: Effort normal and breath sounds normal. No respiratory distress. She has no wheezes. She has no rales. She exhibits no tenderness.  Abdominal: Soft. Normal appearance, normal aorta and bowel sounds are normal. She exhibits no distension, no abdominal bruit, no pulsatile midline mass and no mass. There is no splenomegaly or hepatomegaly. There is no tenderness.  Musculoskeletal: Normal range of motion. She exhibits no edema.  Lymphadenopathy:    She has no cervical adenopathy.  Neurological: She is alert and oriented to person, place, and time. She has normal reflexes.    Skin: Skin is warm and dry.  Psychiatric: Judgment normal.   BP 118/64   Pulse 78   Temp 98 F (36.7 C) (Oral)   Ht _0  (1.575 m)   Wt 254 lb (115.2 kg)   BMI 46.46 kg/m      Assessment & Plan:  Linda Shepherd in today with chief complaint of Annual Exam (No pap. School physical for college)   1. Annual physical exam - CMP14+EGFR - Lipid panel - Thyroid Panel With TSH  2. Immunity status testing Titers pending - Hepatitis B surface antibody - Measles/Mumps/Rubella Immunity - Varicella zoster antibody, IgG  Tetanus today  Mary-Margaret Hassell Done, FNP

## 2017-12-30 NOTE — Addendum Note (Signed)
Addended by: Rolena Infante on: 12/30/2017 11:44 AM   Modules accepted: Orders

## 2017-12-31 LAB — MEASLES/MUMPS/RUBELLA IMMUNITY
MUMPS ABS, IGG: 9.9 AU/mL — ABNORMAL LOW (ref >10.9)
RUBEOLA AB, IGG: 138 AU/mL (ref >29.9)
Rubella Antibodies, IGG: 1.46 index (ref >0.99)

## 2017-12-31 LAB — THYROID PANEL WITH TSH
Free Thyroxine Index: 1.5 (ref 1.2–4.9)
T3 Uptake Ratio: 21 % — ABNORMAL LOW (ref 24–39)
T4 TOTAL: 7.1 ug/dL (ref 4.5–12.0)
TSH: 3.92 u[IU]/mL (ref 0.450–4.500)

## 2017-12-31 LAB — CMP14+EGFR
A/G RATIO: 1.6 (ref 1.2–2.2)
ALBUMIN: 4 g/dL (ref 3.5–5.5)
ALT: 13 IU/L (ref 0–32)
AST: 15 IU/L (ref 0–40)
Alkaline Phosphatase: 67 IU/L (ref 39–117)
BUN/Creatinine Ratio: 15 (ref 9–23)
BUN: 10 mg/dL (ref 6–24)
Bilirubin Total: 0.4 mg/dL (ref 0.0–1.2)
CALCIUM: 9.4 mg/dL (ref 8.7–10.2)
CO2: 25 mmol/L (ref 20–29)
CREATININE: 0.65 mg/dL (ref 0.57–1.00)
Chloride: 102 mmol/L (ref 96–106)
GFR, EST AFRICAN AMERICAN: 127 mL/min/{1.73_m2} (ref >59)
GFR, EST NON AFRICAN AMERICAN: 110 mL/min/{1.73_m2} (ref >59)
GLOBULIN, TOTAL: 2.5 g/dL (ref 1.5–4.5)
Glucose: 82 mg/dL (ref 65–99)
Potassium: 4.3 mmol/L (ref 3.5–5.2)
SODIUM: 139 mmol/L (ref 134–144)
TOTAL PROTEIN: 6.5 g/dL (ref 6.0–8.5)

## 2017-12-31 LAB — LIPID PANEL
CHOL/HDL RATIO: 3.5 ratio (ref 0.0–4.4)
Cholesterol, Total: 177 mg/dL (ref 100–199)
HDL: 51 mg/dL (ref >39)
LDL CALC: 111 mg/dL — AB (ref 0–99)
Triglycerides: 76 mg/dL (ref 0–149)
VLDL Cholesterol Cal: 15 mg/dL (ref 5–40)

## 2017-12-31 LAB — VARICELLA ZOSTER ANTIBODY, IGG: Varicella zoster IgG: 2522 index (ref >165)

## 2017-12-31 LAB — HEPATITIS B SURFACE ANTIBODY, QUANTITATIVE: HEPATITIS B SURF AB QUANT: 361 m[IU]/mL

## 2018-01-04 ENCOUNTER — Ambulatory Visit (INDEPENDENT_AMBULATORY_CARE_PROVIDER_SITE_OTHER): Payer: 59

## 2018-01-04 DIAGNOSIS — Z111 Encounter for screening for respiratory tuberculosis: Secondary | ICD-10-CM | POA: Diagnosis not present

## 2018-01-06 LAB — TB SKIN TEST
Induration: 0 mm
TB SKIN TEST: NEGATIVE

## 2018-01-11 ENCOUNTER — Ambulatory Visit (INDEPENDENT_AMBULATORY_CARE_PROVIDER_SITE_OTHER): Payer: 59 | Admitting: Family Medicine

## 2018-01-11 ENCOUNTER — Encounter: Payer: Self-pay | Admitting: Family Medicine

## 2018-01-11 DIAGNOSIS — M722 Plantar fascial fibromatosis: Secondary | ICD-10-CM

## 2018-01-11 NOTE — Assessment & Plan Note (Signed)
Patient is here with persistent left-sided plantar fasciitis.  Prior interventions have not been overly helpful.  We discussed at length patient's understanding of the often frustrating prognosis with plantar fasciitis. -Patient is to discontinue her custom orthotics -New green shoe inserts with scaphoid pad provided today. -Gel heel cup provided today -Continue ice compression and elevation. -Continue stretching exercises and night splint as tolerated -Continue footwear use while at home. -Follow-up in 4 weeks.  Next: Discussed possibility of injection in the future (patient is not currently interested).  Patient may benefit from another attempt at custom orthotics with a focus on softer padding.

## 2018-01-11 NOTE — Progress Notes (Signed)
   HPI  CC: Left-sided plantar fasciitis Patient is here to follow-up regarding her left-sided plantar fasciitis.  She was last seen here in January, at that time we made her custom orthotics and provided her with stretching exercises to perform at home.  She states that the orthotics became so uncomfortable that she has been unable to tolerate them after about 2 weeks of use.  Patient endorses compliance with her stretching as well as regular icing and Epson salt baths.  Patient feels as though her left foot is now worse.  She denies any new trauma, injury, or event which may have caused this.  Pain is located along the plantar aspect of her calcaneus.  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  She denies any numbness, weakness, or paresthesias.  No bruising or erythema.  Medications/Interventions Tried: Administrator, Epsom salts, stretching, ice  See HPI and/or previous note for associated ROS.  Objective: BP 119/66   Ht 5' 3"  (1.6 m)   Wt 250 lb (113.4 kg)   BMI 44.29 kg/m  Gen: NAD, well groomed, a/o x3, normal affect.  CV: Well-perfused. Warm.  Resp: Non-labored.  Neuro: Sensation intact throughout. No gross coordination deficits.  Gait: Nonpathologic posture, unremarkable stride without signs of limp or balance issues. Ankle/Foot, Left: TTP noted at the plantar and medial aspect of the calcaneus. No visible erythema, swelling, ecchymosis, or bony deformity. Notable flexible pes planus deformity. Transverse arch grossly absent; No evidence of tibiotalar deviation; Range of motion is full in all directions. Strength is 5/5 in all directions. No tenderness at the insertion/body/myotendinous junction of the Achilles tendon; No tenderness on posterior aspects of lateral and medial malleolus; No pain with calcaneal squeeze; No tenderness at the distal metatarsals; Able to walk 4  steps.   Assessment and plan:  Plantar fasciitis of left foot Patient is here with persistent left-sided plantar fasciitis.  Prior interventions have not been overly helpful.  We discussed at length patient's understanding of the often frustrating prognosis with plantar fasciitis. -Patient is to discontinue her custom orthotics -New green shoe inserts with scaphoid pad provided today. -Gel heel cup provided today -Continue ice compression and elevation. -Continue stretching exercises and night splint as tolerated -Continue footwear use while at home. -Follow-up in 4 weeks.  Next: Discussed possibility of injection in the future (patient is not currently interested).  Patient may benefit from another attempt at custom orthotics with a focus on softer padding.   Linda Leatherwood, MD,MS [facility] Fellow 01/11/2018 5:49 PM

## 2018-01-20 ENCOUNTER — Other Ambulatory Visit (INDEPENDENT_AMBULATORY_CARE_PROVIDER_SITE_OTHER): Payer: 59 | Admitting: *Deleted

## 2018-01-20 DIAGNOSIS — Z111 Encounter for screening for respiratory tuberculosis: Secondary | ICD-10-CM | POA: Diagnosis not present

## 2018-01-23 LAB — TB SKIN TEST
INDURATION: 0 mm
TB Skin Test: NEGATIVE

## 2018-01-27 ENCOUNTER — Telehealth: Payer: Self-pay | Admitting: Family

## 2018-01-27 NOTE — Telephone Encounter (Signed)
Documents reviewed and corrected and refaxed to [employer] fax. Informed patient that this was done and to contact us if they did not receive paperwork or if it is still incorrect. Patient verbalized understanding.

## 2018-02-08 ENCOUNTER — Ambulatory Visit: Payer: 59 | Admitting: Family Medicine

## 2018-02-14 ENCOUNTER — Telehealth: Payer: Self-pay | Admitting: Family

## 2018-02-15 NOTE — Telephone Encounter (Signed)
Advised patient that I have a copy of paperwork and she will come by for shot this week.

## 2018-03-10 DIAGNOSIS — Z1231 Encounter for screening mammogram for malignant neoplasm of breast: Secondary | ICD-10-CM | POA: Diagnosis not present

## 2018-03-17 DIAGNOSIS — Z23 Encounter for immunization: Secondary | ICD-10-CM | POA: Diagnosis not present

## 2018-03-21 ENCOUNTER — Encounter: Payer: Self-pay | Admitting: Obstetrics and Gynecology

## 2018-04-05 NOTE — Progress Notes (Signed)
42 y.o. G4P4 SingleCaucasianF here for annual exam.   Patient states that she has been bleeding lightly for about 2 weeks. Typically she just has intermittent spotting.  She has some intermittent lower back pain for the last few weeks. She has a mirena IUD, placed in 11/16.  She is sexually active, seldom, same partner (he is a Administrator and is gone for long periods of time). Not using condoms    No LMP recorded. (Menstrual status: IUD).          Sexually active: Yes.    The current method of family planning is IUD.    Exercising: Yes.    The patient has a physically strenuous job, but has no regular exercise apart from work.  Smoker:  no  Health Maintenance: Pap:  03/24/2016 normal, neg HR HPV, 08/16/12 WNL, neg HR HPV History of abnormal Pap:  Yes more than 10 years ago per patient  MMG:  03/10/2018 BIRAD 1 Negative Colonoscopy:  n/a BMD:   n/a TDaP:  12/30/2017 Gardasil: no, discussed (not currently covered by her insurance).   reports that she has never smoked. She has never used smokeless tobacco. She reports that she does not drink alcohol or use drugs. 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   Past Medical History:  Diagnosis Date  . Anemia   . Prediabetes     Past Surgical History:  Procedure Laterality Date  . BREAST BIOPSY  2000   benign  . TUBAL LIGATION  2004   postpartum    Current Outpatient Medications  Medication Sig Dispense Refill  . levonorgestrel (MIRENA) 20 MCG/24HR IUD 1 Intra Uterine Device (1 each total) by Intrauterine route once. 1 Intra Uterine Device 0   No current facility-administered medications for this visit.     Family History  Problem Relation Age of Onset  . Diabetes Maternal Grandmother   . Congestive Heart Failure Maternal Grandmother   . Depression Mother   . Breast cancer Cousin 71       paternal first cousin  . Colon cancer Neg Hx   Paternal first cousin with breast cancer 76 years  old  Review of Systems  Constitutional: Negative.   HENT: Negative.   Eyes: Negative.   Respiratory: Negative.   Cardiovascular: Negative.   Gastrointestinal: Negative.   Endocrine: Negative.   Genitourinary:       Menstrual cycle change   Musculoskeletal: Negative.   Skin: Negative.   Allergic/Immunologic: Negative.   Neurological: Negative.   Hematological: Negative.   Psychiatric/Behavioral: Negative.   All other systems reviewed and are negative.   Exam:   BP 115/68   Pulse 66   Resp 14   Ht 5' 2.5" (1.588 m)   Wt 252 lb (114.3 kg)   BMI 45.36 kg/m   Weight change: @WEIGHTCHANGE @ Height:   Height: 5' 2.5" (158.8 cm)  Ht Readings from Last 3 Encounters:  04/12/18 5' 2.5" (1.588 m)  01/11/18 5' 3"  (1.6 m)  12/30/17 5' 2"  (1.575 m)    General appearance: alert, cooperative and appears stated age Head: Normocephalic, without obvious abnormality, atraumatic Neck: no adenopathy, supple, symmetrical, trachea midline and thyroid normal to inspection and palpation Lungs: clear to auscultation bilaterally Cardiovascular: regular rate and rhythm Breasts: normal appearance, no masses or tenderness Abdomen: soft, non-tender; non distended,  no masses,  no organomegaly Extremities: extremities normal, atraumatic, no cyanosis or edema Skin: Skin color, texture, turgor normal. No rashes or  lesions Lymph nodes: Cervical, supraclavicular, and axillary nodes normal. No abnormal inguinal nodes palpated Neurologic: Grossly normal   Pelvic: External genitalia:  no lesions              Urethra:  normal appearing urethra with no masses, tenderness or lesions              Bartholins and Skenes: normal                 Vagina: normal appearing vagina with normal color and discharge, no lesions              Cervix: no lesions and IUD string 3-4 cm               Bimanual Exam:  Uterus:  normal size, contour, position, consistency, mobility, non-tender and anteverted               Adnexa: no mass, fullness, tenderness               Rectovaginal: Confirms               Anus:  normal sphincter tone, no lesions  Chaperone was present for exam.  A:  Well Woman with normal exam  Vit d def  IUD check  Pre-diabetic  Spotting for 2 weeks, if persists she will call.   Elevated BMI, weight unchanged  P:   Pap next year  Mammogram UTD  Discussed gardasil, will await insurance coverage  Discussed breast self exam  Discussed calcium and vit D intake  Genprobe, HIV, RPR  Vit D  CBC, HgbA1C  Other labs just done  Discussed weight loss, dietary changes, exercise, weight watchers

## 2018-04-12 ENCOUNTER — Ambulatory Visit (INDEPENDENT_AMBULATORY_CARE_PROVIDER_SITE_OTHER): Payer: 59 | Admitting: Obstetrics and Gynecology

## 2018-04-12 ENCOUNTER — Other Ambulatory Visit: Payer: Self-pay

## 2018-04-12 ENCOUNTER — Encounter: Payer: Self-pay | Admitting: Obstetrics and Gynecology

## 2018-04-12 VITALS — BP 115/68 | HR 66 | Resp 14 | Ht 62.5 in | Wt 252.0 lb

## 2018-04-12 DIAGNOSIS — Z0000 Encounter for general adult medical examination without abnormal findings: Secondary | ICD-10-CM

## 2018-04-12 DIAGNOSIS — Z6841 Body Mass Index (BMI) 40.0 and over, adult: Secondary | ICD-10-CM | POA: Diagnosis not present

## 2018-04-12 DIAGNOSIS — Z01419 Encounter for gynecological examination (general) (routine) without abnormal findings: Secondary | ICD-10-CM | POA: Diagnosis not present

## 2018-04-12 DIAGNOSIS — Z113 Encounter for screening for infections with a predominantly sexual mode of transmission: Secondary | ICD-10-CM

## 2018-04-12 DIAGNOSIS — E559 Vitamin D deficiency, unspecified: Secondary | ICD-10-CM

## 2018-04-12 DIAGNOSIS — Z30431 Encounter for routine checking of intrauterine contraceptive device: Secondary | ICD-10-CM

## 2018-04-12 DIAGNOSIS — R7303 Prediabetes: Secondary | ICD-10-CM | POA: Diagnosis not present

## 2018-04-12 NOTE — Patient Instructions (Signed)
EXERCISE AND DIET:  We recommended that you start or continue a regular exercise program for good health. Regular exercise means any activity that makes your heart beat faster and makes you sweat.  We recommend exercising at least 30 minutes per day at least 3 days a week, preferably 4 or 5.  We also recommend a diet low in fat and sugar.  Inactivity, poor dietary choices and obesity can cause diabetes, heart attack, stroke, and kidney damage, among others.    ALCOHOL AND SMOKING:  Women should limit their alcohol intake to no more than 7 drinks/beers/glasses of wine (combined, not each!) per week. Moderation of alcohol intake to this level decreases your risk of breast cancer and liver damage. And of course, no recreational drugs are part of a healthy lifestyle.  And absolutely no smoking or even second hand smoke. Most people know smoking can cause heart and lung diseases, but did you know it also contributes to weakening of your bones? Aging of your skin?  Yellowing of your teeth and nails?  CALCIUM AND VITAMIN D:  Adequate intake of calcium and Vitamin D are recommended.  The recommendations for exact amounts of these supplements seem to change often, but generally speaking 600 mg of calcium (either carbonate or citrate) and 800 units of Vitamin D per day seems prudent. Certain women may benefit from higher intake of Vitamin D.  If you are among these women, your doctor will have told you during your visit.    PAP SMEARS:  Pap smears, to check for cervical cancer or precancers,  have traditionally been done yearly, although recent scientific advances have shown that most women can have pap smears less often.  However, every woman still should have a physical exam from her gynecologist every year. It will include a breast check, inspection of the vulva and vagina to check for abnormal growths or skin changes, a visual exam of the cervix, and then an exam to evaluate the size and shape of the uterus and  ovaries.  And after 42 years of age, a rectal exam is indicated to check for rectal cancers. We will also provide age appropriate advice regarding health maintenance, like when you should have certain vaccines, screening for sexually transmitted diseases, bone density testing, colonoscopy, mammograms, etc.   MAMMOGRAMS:  All women over 40 years old should have a yearly mammogram. Many facilities now offer a "3D" mammogram, which may cost around $50 extra out of pocket. If possible,  we recommend you accept the option to have the 3D mammogram performed.  It both reduces the number of women who will be called back for extra views which then turn out to be normal, and it is better than the routine mammogram at detecting truly abnormal areas.    COLONOSCOPY:  Colonoscopy to screen for colon cancer is recommended for all women at age 50.  We know, you hate the idea of the prep.  We agree, BUT, having colon cancer and not knowing it is worse!!  Colon cancer so often starts as a polyp that can be seen and removed at colonscopy, which can quite literally save your life!  And if your first colonoscopy is normal and you have no family history of colon cancer, most women don't have to have it again for 10 years.  Once every ten years, you can do something that may end up saving your life, right?  We will be happy to help you get it scheduled when you are ready.    Be sure to check your insurance coverage so you understand how much it will cost.  It may be covered as a preventative service at no cost, but you should check your particular policy.      Breast Self-Awareness Breast self-awareness means being familiar with how your breasts look and feel. It involves checking your breasts regularly and reporting any changes to your health care provider. Practicing breast self-awareness is important. A change in your breasts can be a sign of a serious medical problem. Being familiar with how your breasts look and feel allows  you to find any problems early, when treatment is more likely to be successful. All women should practice breast self-awareness, including women who have had breast implants. How to do a breast self-exam One way to learn what is normal for your breasts and whether your breasts are changing is to do a breast self-exam. To do a breast self-exam: Look for Changes  1. Remove all the clothing above your waist. 2. Stand in front of a mirror in a room with good lighting. 3. Put your hands on your hips. 4. Push your hands firmly downward. 5. Compare your breasts in the mirror. Look for differences between them (asymmetry), such as: ? Differences in shape. ? Differences in size. ? Puckers, dips, and bumps in one breast and not the other. 6. Look at each breast for changes in your skin, such as: ? Redness. ? Scaly areas. 7. Look for changes in your nipples, such as: ? Discharge. ? Bleeding. ? Dimpling. ? Redness. ? A change in position. Feel for Changes  Carefully feel your breasts for lumps and changes. It is best to do this while lying on your back on the floor and again while sitting or standing in the shower or tub with soapy water on your skin. Feel each breast in the following way:  Place the arm on the side of the breast you are examining above your head.  Feel your breast with the other hand.  Start in the nipple area and make  inch (2 cm) overlapping circles to feel your breast. Use the pads of your three middle fingers to do this. Apply light pressure, then medium pressure, then firm pressure. The light pressure will allow you to feel the tissue closest to the skin. The medium pressure will allow you to feel the tissue that is a little deeper. The firm pressure will allow you to feel the tissue close to the ribs.  Continue the overlapping circles, moving downward over the breast until you feel your ribs below your breast.  Move one finger-width toward the center of the body.  Continue to use the  inch (2 cm) overlapping circles to feel your breast as you move slowly up toward your collarbone.  Continue the up and down exam using all three pressures until you reach your armpit.  Write Down What You Find  Write down what is normal for each breast and any changes that you find. Keep a written record with breast changes or normal findings for each breast. By writing this information down, you do not need to depend only on memory for size, tenderness, or location. Write down where you are in your menstrual cycle, if you are still menstruating. If you are having trouble noticing differences in your breasts, do not get discouraged. With time you will become more familiar with the variations in your breasts and more comfortable with the exam. How often should I examine my breasts? Examine   your breasts every month. If you are breastfeeding, the best time to examine your breasts is after a feeding or after using a breast pump. If you menstruate, the best time to examine your breasts is 5-7 days after your period is over. During your period, your breasts are lumpier, and it may be more difficult to notice changes. When should I see my health care provider? See your health care provider if you notice:  A change in shape or size of your breasts or nipples.  A change in the skin of your breast or nipples, such as a reddened or scaly area.  Unusual discharge from your nipples.  A lump or thick area that was not there before.  Pain in your breasts.  Anything that concerns you.  This information is not intended to replace advice given to you by your health care provider. Make sure you discuss any questions you have with your health care provider. Document Released: 07/26/2005 Document Revised: 01/01/2016 Document Reviewed: 06/15/2015 Elsevier Interactive Patient Education  2018 Elsevier Inc.  

## 2018-04-13 ENCOUNTER — Other Ambulatory Visit: Payer: Self-pay | Admitting: *Deleted

## 2018-04-13 DIAGNOSIS — R7303 Prediabetes: Secondary | ICD-10-CM

## 2018-04-13 LAB — CBC
HEMATOCRIT: 40.4 % (ref 34.0–46.6)
HEMOGLOBIN: 12.9 g/dL (ref 11.1–15.9)
MCH: 28 pg (ref 26.6–33.0)
MCHC: 31.9 g/dL (ref 31.5–35.7)
MCV: 88 fL (ref 79–97)
Platelets: 264 10*3/uL (ref 150–450)
RBC: 4.6 x10E6/uL (ref 3.77–5.28)
RDW: 14.2 % (ref 12.3–15.4)
WBC: 6 10*3/uL (ref 3.4–10.8)

## 2018-04-13 LAB — GC/CHLAMYDIA PROBE AMP
Chlamydia trachomatis, NAA: NEGATIVE
Neisseria gonorrhoeae by PCR: NEGATIVE

## 2018-04-13 LAB — HEMOGLOBIN A1C
ESTIMATED AVERAGE GLUCOSE: 120 mg/dL
Hgb A1c MFr Bld: 5.8 % — ABNORMAL HIGH (ref 4.8–5.6)

## 2018-04-13 LAB — RPR: RPR: NONREACTIVE

## 2018-04-13 LAB — VITAMIN D 25 HYDROXY (VIT D DEFICIENCY, FRACTURES): VIT D 25 HYDROXY: 22 ng/mL — AB (ref 30.0–100.0)

## 2018-04-13 LAB — HIV ANTIBODY (ROUTINE TESTING W REFLEX): HIV Screen 4th Generation wRfx: NONREACTIVE

## 2018-04-25 ENCOUNTER — Telehealth: Payer: Self-pay | Admitting: Obstetrics and Gynecology

## 2018-04-25 DIAGNOSIS — Z0184 Encounter for antibody response examination: Secondary | ICD-10-CM

## 2018-04-25 NOTE — Telephone Encounter (Signed)
I don't see why we can't draw it, as long as she can get the immunization with our results else where.

## 2018-04-25 NOTE — Telephone Encounter (Signed)
Patient is wondering if Dr Talbert Nan can do the titer for MMR.

## 2018-04-25 NOTE — Telephone Encounter (Signed)
Spoke with patient. 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 Last MMR with PCP 12/2017, measles equivical, mumps and rubella immune. [facility].   Advised patient MMR titer not typically drawn in our office, because vaccines not given at [facility]. Patient states she is due to return to [facility] department next week if 2nd titer needed.    Advised will review with Dr. Talbert Nan and return call.   Dr. Talbert Nan -please advise on MMR titer

## 2018-04-26 NOTE — Telephone Encounter (Signed)
Dr. Talbert Nan -order pended for MMR Immunity, please review and sign.

## 2018-04-26 NOTE — Telephone Encounter (Signed)
Patient called and scheduled MMR titer for 05/01/18.

## 2018-04-26 NOTE — Telephone Encounter (Signed)
Left detailed message, ok per dpr. Advised can have MMR titer drawn at [facility], will need to schedule immunization, if needed, with another provider. Return call to office to schedule lab appt or with any additonal questions.

## 2018-05-01 ENCOUNTER — Other Ambulatory Visit: Payer: 59

## 2018-05-01 ENCOUNTER — Telehealth: Payer: Self-pay | Admitting: Obstetrics and Gynecology

## 2018-05-01 ENCOUNTER — Other Ambulatory Visit (INDEPENDENT_AMBULATORY_CARE_PROVIDER_SITE_OTHER): Payer: 59

## 2018-05-01 DIAGNOSIS — Z0184 Encounter for antibody response examination: Secondary | ICD-10-CM

## 2018-05-01 NOTE — Telephone Encounter (Signed)
Patient came in for an MMR titer today. She left a form that will need completed when the results come back. She come to the office to pick it up when it's ready.

## 2018-05-02 ENCOUNTER — Telehealth: Payer: Self-pay | Admitting: Obstetrics and Gynecology

## 2018-05-02 LAB — MEASLES/MUMPS/RUBELLA IMMUNITY
MUMPS ABS, IGG: 118 [AU]/ml (ref >10.9)
RUBEOLA AB, IGG: 97.5 AU/mL (ref >16.4)
Rubella Antibodies, IGG: 25.5 index (ref >0.99)

## 2018-05-02 NOTE — Telephone Encounter (Signed)
Patient calling requesting results. Patient stated that she got a MyChart message that the results were back.

## 2018-05-02 NOTE — Telephone Encounter (Signed)
Spoke with patient, advised as seen below per Dr. Talbert Nan. Advised copy of results and completed immunization form placed at front office for pick up.  Patient verbalizes understanding and is agreeable. Encounter closed.     Notes recorded by Salvadore Dom, MD on 05/02/2018 at 12:50 PM EDT Results and any recommendations were sent via [place].   Hi Linda Shepherd, You are immune to measles, mumps and rubella. You should be able to print your results.  If you have any questions or need assistance, please call the office. [place] Boast

## 2018-05-02 NOTE — Telephone Encounter (Signed)
Immunization form to Dr. Talbert Nan to review and sign.

## 2018-12-21 ENCOUNTER — Encounter: Payer: Self-pay | Admitting: Obstetrics and Gynecology

## 2018-12-21 ENCOUNTER — Telehealth: Payer: Self-pay

## 2018-12-21 NOTE — Telephone Encounter (Signed)
Left message to call [facility] at [phone].   Linda Shepherd  to Salvadore Dom, MD       12/21/18 5:55 AM  Good morning,  I hope you are doing well!  I am in need of scheduling my annual physical soon. I do have an appointment in September but need a physical before that due to insurance increase. More importantly, on Mothers Day i found a bald patch on the back of my head. Its tender to the touch and has me concerned (it's the size of a silver dollar). Please let me know my options. Thank you!! Have a blessed day!    Linda Shepherd  to Salvadore Dom, MD       12/21/18 5:41 AM  Good morning,  I hope you are doing well!  On mothers day I found a bald patch size of a silver dollar on the back of my head. I know things are difficult to schedule with COVID19. I was hoping to schedule my annual physical as well, if you are doing them. Please forward any recommendations. Thank you!!

## 2018-12-21 NOTE — Telephone Encounter (Signed)
Telephone call created. Encounter closed.

## 2018-12-25 NOTE — Telephone Encounter (Signed)
Spoke with patient.   1.Patient reports a bald patch of skin on the back of her head, tender to touch, tenderness is improving. Denies any other hair thinning.  Denies any recent injury, redness or swelling. Denies any GYN concerns. Recommended patient f/u with PCP for further evaluation.   2. Patient asking if AEX can be moved to earlier date prior to 04/10/2019? Advised last AEX 04/12/18, can move to on or after 04/13/2019. Patient will f/u with PCP to see what options she has for scheduling wellness exam.   Advised Dr. Talbert Nan will review, our office will return call if any additional recommendations.   Routing to provider for final review. Patient is agreeable to disposition. Will close encounter.

## 2019-01-02 ENCOUNTER — Encounter: Payer: Self-pay | Admitting: Family Medicine

## 2019-01-02 ENCOUNTER — Other Ambulatory Visit: Payer: Self-pay

## 2019-01-02 ENCOUNTER — Ambulatory Visit (INDEPENDENT_AMBULATORY_CARE_PROVIDER_SITE_OTHER): Payer: 59 | Admitting: Family Medicine

## 2019-01-02 VITALS — BP 117/71 | HR 69 | Temp 98.4°F | Ht 62.5 in | Wt 256.6 lb

## 2019-01-02 DIAGNOSIS — B354 Tinea corporis: Secondary | ICD-10-CM

## 2019-01-02 DIAGNOSIS — B350 Tinea barbae and tinea capitis: Secondary | ICD-10-CM

## 2019-01-02 MED ORDER — TERBINAFINE HCL 1 % EX CREA: 1.0000 "application " | TOPICAL_CREAM | Freq: Two times a day (BID) | CUTANEOUS | 0 refills | Status: DC

## 2019-01-02 MED ORDER — TERBINAFINE HCL 250 MG PO TABS: ORAL_TABLET | ORAL | 0 refills | Status: DC

## 2019-01-02 NOTE — Progress Notes (Signed)
BP 117/71   Pulse 69   Temp 98.4 F (36.9 C) (Oral)   Ht 5' 2.5" (1.588 m)   Wt 256 lb 9.6 oz (116.4 kg)   BMI 46.18 kg/m    Subjective:   Patient ID: Linda Shepherd, female    DOB: 1975/10/19, 43 y.o.   MRN: [id number]  HPI: Linda Shepherd is a 43 y.o. female presenting on 01/02/2019 for Rash (x 1 1/2 months- RUQ - Patient states it has gotten worse and is now itching and burning ) and bald spot on scalp (Patient states that she found it on mothers day )   HPI Rash on body and scalp Patient is coming in with complaints of rash on body and scalp that is been there for a month to 2 months maybe.  She said the one on her abdomen on the upper part of her abdomen started and is very pruritic and has a pink ring around it but is still very small and so she did not think much of it and then she developed this patch on the back of her scalp that she found on Mother's Day that is about the size of a silver dollar where she has no hair.  She normally has very thick full hair and so this bald patch alarmed her.  She denies any fevers or chills.  She says the patch on her scalp is also very pruritic.  She denies any the wound will being painful.  Relevant past medical, surgical, family and social history reviewed and updated as indicated. Interim medical history since our last visit reviewed. Allergies and medications reviewed and updated.  Review of Systems  Constitutional: Negative for chills and fever.  Eyes: Negative for visual disturbance.  Respiratory: Negative for chest tightness and shortness of breath.   Cardiovascular: Negative for chest pain and leg swelling.  Musculoskeletal: Negative for back pain and gait problem.  Skin: Positive for color change and rash. Negative for wound.  Neurological: Negative for light-headedness and headaches.  Psychiatric/Behavioral: Negative for agitation and behavioral problems.  All other systems reviewed and are negative.   Per HPI unless  specifically indicated above   Allergies as of 01/02/2019   No Known Allergies     Medication List       Accurate as of Jan 02, 2019  3:28 PM. If you have any questions, ask your nurse or doctor.        levonorgestrel 20 MCG/24HR IUD Commonly known as:  MIRENA 1 Intra Uterine Device (1 each total) by Intrauterine route once.   terbinafine 1 % cream Commonly known as:  LAMISIL Apply 1 application topically 2 (two) times daily. Started by:  Fransisca Kaufmann , MD   terbinafine 250 MG tablet Commonly known as:  LAMISIL Take 250 mg daily for 3 days and then once a week for 3 more doses Started by:  Fransisca Kaufmann , MD        Objective:   BP 117/71   Pulse 69   Temp 98.4 F (36.9 C) (Oral)   Ht 5' 2.5" (1.588 m)   Wt 256 lb 9.6 oz (116.4 kg)   BMI 46.18 kg/m   Wt Readings from Last 3 Encounters:  01/02/19 256 lb 9.6 oz (116.4 kg)  04/12/18 252 lb (114.3 kg)  01/11/18 250 lb (113.4 kg)    Physical Exam Vitals signs and nursing note reviewed.  Constitutional:      General: She is not in acute distress.  Appearance: She is well-developed. She is not diaphoretic.  Eyes:     Conjunctiva/sclera: Conjunctivae normal.  Skin:    General: Skin is warm and dry.     Findings: Rash (Small ringlike dime size plaque with pink raised borders and central clearing, no signs of erythema.) present.       Neurological:     Mental Status: She is alert and oriented to person, place, and time.     Coordination: Coordination normal.  Psychiatric:        Behavior: Behavior normal.       Assessment & Plan:   Problem List Items Addressed This Visit    None    Visit Diagnoses    Tinea corporis    -  Primary   Relevant Medications   terbinafine (LAMISIL) 1 % cream   terbinafine (LAMISIL) 250 MG tablet   Tinea capitis       Relevant Medications   terbinafine (LAMISIL) 1 % cream   terbinafine (LAMISIL) 250 MG tablet       Follow up plan: Return if symptoms worsen  or fail to improve.  Counseling provided for all of the vaccine components No orders of the defined types were placed in this encounter.   Caryl Pina, MD [facility] 01/02/2019, 3:28 PM

## 2019-01-09 ENCOUNTER — Encounter: Payer: 59 | Admitting: Family

## 2019-01-16 ENCOUNTER — Encounter: Payer: Self-pay | Admitting: Family

## 2019-01-16 ENCOUNTER — Other Ambulatory Visit: Payer: Self-pay

## 2019-01-16 ENCOUNTER — Ambulatory Visit (INDEPENDENT_AMBULATORY_CARE_PROVIDER_SITE_OTHER): Payer: 59 | Admitting: Family

## 2019-01-16 VITALS — BP 114/71 | HR 77 | Temp 97.7°F | Ht 62.5 in | Wt 256.8 lb

## 2019-01-16 DIAGNOSIS — Z0000 Encounter for general adult medical examination without abnormal findings: Secondary | ICD-10-CM

## 2019-01-16 DIAGNOSIS — Z975 Presence of (intrauterine) contraceptive device: Secondary | ICD-10-CM

## 2019-01-16 DIAGNOSIS — Z6841 Body Mass Index (BMI) 40.0 and over, adult: Secondary | ICD-10-CM | POA: Diagnosis not present

## 2019-01-16 DIAGNOSIS — B354 Tinea corporis: Secondary | ICD-10-CM

## 2019-01-16 DIAGNOSIS — Z713 Dietary counseling and surveillance: Secondary | ICD-10-CM

## 2019-01-16 DIAGNOSIS — Z0001 Encounter for general adult medical examination with abnormal findings: Secondary | ICD-10-CM | POA: Diagnosis not present

## 2019-01-16 NOTE — Patient Instructions (Signed)

## 2019-01-16 NOTE — Progress Notes (Addendum)
   Subjective:    Patient ID: Linda Shepherd, female    DOB: 1975-12-13, 43 y.o.   MRN: [id number]  Chief Complaint  Patient presents with  . Annual Exam    HPI PT presents to the office today for CPE without pap. She is followed by her GYN annually. She an IUD in place.   Pt currently has ringworm on her abdomen and scalp. She is currently taking terbinafine. States her rash is improving.    Review of Systems  All other systems reviewed and are negative.      Objective:   Physical Exam Vitals signs reviewed.  Constitutional:      General: She is not in acute distress.    Appearance: She is well-developed.  HENT:     Head: Normocephalic and atraumatic.     Right Ear: Tympanic membrane normal.     Left Ear: Tympanic membrane normal.  Eyes:     Pupils: Pupils are equal, round, and reactive to light.  Neck:     Musculoskeletal: Normal range of motion and neck supple.     Thyroid: No thyromegaly.  Cardiovascular:     Rate and Rhythm: Normal rate and regular rhythm.     Heart sounds: Normal heart sounds. No murmur.  Pulmonary:     Effort: Pulmonary effort is normal. No respiratory distress.     Breath sounds: Normal breath sounds. No wheezing.  Abdominal:     General: Bowel sounds are normal. There is no distension.     Palpations: Abdomen is soft.     Tenderness: There is no abdominal tenderness.  Musculoskeletal: Normal range of motion.        General: No tenderness.  Skin:    General: Skin is warm and dry.  Neurological:     Mental Status: She is alert and oriented to person, place, and time.     Cranial Nerves: No cranial nerve deficit.     Deep Tendon Reflexes: Reflexes are normal and symmetric.  Psychiatric:        Behavior: Behavior normal.        Thought Content: Thought content normal.        Judgment: Judgment normal.       BP 114/71   Pulse 77   Temp 97.7 F (36.5 C) (Oral)   Ht 5' 2.5" (1.588 m)   Wt 256 lb 12.8 oz (116.5 kg)   BMI 46.22 kg/m       Assessment & Plan:  Linda Shepherd comes in today with chief complaint of Annual Exam   Diagnosis and orders addressed:  1. Annual physical exa - CMP14+EGFR; Future - CBC with Differential/Platelet; Future - Lipid panel; Future - TSH; Future  2. Morbid obesity with BMI of 45.0-49.9, adult (HCC) - CMP14+EGFR; Future - CBC with Differential/Platelet; Future  3. Tinea corporis Continue terbinafine   4. IUD (intrauterine device) in place  5. Weight loss counseling, encounter for - Amb Referral to DSME/T    Labs pending Health Maintenance reviewed Diet and exercise encouraged  Follow up plan: 1 year    Evelina Dun, FNP

## 2019-01-16 NOTE — Addendum Note (Signed)
Addended by: Evelina Dun A on: 01/16/2019 04:44 PM   Modules accepted: Orders

## 2019-01-25 ENCOUNTER — Other Ambulatory Visit: Payer: Self-pay

## 2019-01-26 ENCOUNTER — Ambulatory Visit (INDEPENDENT_AMBULATORY_CARE_PROVIDER_SITE_OTHER): Payer: 59

## 2019-01-26 DIAGNOSIS — Z111 Encounter for screening for respiratory tuberculosis: Secondary | ICD-10-CM

## 2019-01-29 LAB — TB SKIN TEST
Induration: 0 mm
TB Skin Test: NEGATIVE

## 2019-02-05 ENCOUNTER — Other Ambulatory Visit: Payer: Self-pay

## 2019-02-05 ENCOUNTER — Other Ambulatory Visit: Payer: 59

## 2019-02-05 DIAGNOSIS — Z0000 Encounter for general adult medical examination without abnormal findings: Secondary | ICD-10-CM | POA: Diagnosis not present

## 2019-02-05 DIAGNOSIS — L659 Nonscarring hair loss, unspecified: Secondary | ICD-10-CM

## 2019-02-05 DIAGNOSIS — Z6841 Body Mass Index (BMI) 40.0 and over, adult: Secondary | ICD-10-CM | POA: Diagnosis not present

## 2019-02-06 LAB — SEDIMENTATION RATE: Sed Rate: 38 mm/hr — ABNORMAL HIGH (ref 0–32)

## 2019-02-06 LAB — CBC WITH DIFFERENTIAL/PLATELET
Basophils Absolute: 0.1 10*3/uL (ref 0.0–0.2)
Basos: 1 %
EOS (ABSOLUTE): 0.2 10*3/uL (ref 0.0–0.4)
Eos: 3 %
Hematocrit: 40.7 % (ref 34.0–46.6)
Hemoglobin: 13.4 g/dL (ref 11.1–15.9)
Immature Grans (Abs): 0 10*3/uL (ref 0.0–0.1)
Immature Granulocytes: 0 %
Lymphocytes Absolute: 3.1 10*3/uL (ref 0.7–3.1)
Lymphs: 47 %
MCH: 28.3 pg (ref 26.6–33.0)
MCHC: 32.9 g/dL (ref 31.5–35.7)
MCV: 86 fL (ref 79–97)
Monocytes Absolute: 0.4 10*3/uL (ref 0.1–0.9)
Monocytes: 6 %
Neutrophils Absolute: 2.8 10*3/uL (ref 1.4–7.0)
Neutrophils: 43 %
Platelets: 246 10*3/uL (ref 150–450)
RBC: 4.74 x10E6/uL (ref 3.77–5.28)
RDW: 13.5 % (ref 11.7–15.4)
WBC: 6.5 10*3/uL (ref 3.4–10.8)

## 2019-02-06 LAB — CMP14+EGFR
ALT: 16 IU/L (ref 0–32)
AST: 19 IU/L (ref 0–40)
Albumin/Globulin Ratio: 1.7 (ref 1.2–2.2)
Albumin: 4.1 g/dL (ref 3.8–4.8)
Alkaline Phosphatase: 76 IU/L (ref 39–117)
BUN/Creatinine Ratio: 18 (ref 9–23)
BUN: 14 mg/dL (ref 6–24)
Bilirubin Total: 0.3 mg/dL (ref 0.0–1.2)
CO2: 24 mmol/L (ref 20–29)
Calcium: 9.1 mg/dL (ref 8.7–10.2)
Chloride: 103 mmol/L (ref 96–106)
Creatinine, Ser: 0.78 mg/dL (ref 0.57–1.00)
GFR calc Af Amer: 108 mL/min/{1.73_m2} (ref >59)
GFR calc non Af Amer: 93 mL/min/{1.73_m2} (ref >59)
Globulin, Total: 2.4 g/dL (ref 1.5–4.5)
Glucose: 84 mg/dL (ref 65–99)
Potassium: 4.3 mmol/L (ref 3.5–5.2)
Sodium: 140 mmol/L (ref 134–144)
Total Protein: 6.5 g/dL (ref 6.0–8.5)

## 2019-02-06 LAB — AUTOIMMUNE PROFILE
Anti Nuclear Antibody (ANA): NEGATIVE
Complement C3, Serum: 139 mg/dL (ref 82–167)
dsDNA Ab: <1 IU/mL (ref 0–9)

## 2019-02-06 LAB — TSH: TSH: 4.08 u[IU]/mL (ref 0.450–4.500)

## 2019-02-06 LAB — C-REACTIVE PROTEIN: CRP: 3 mg/L (ref 0–10)

## 2019-02-06 LAB — LIPID PANEL
Chol/HDL Ratio: 4.3 ratio (ref 0.0–4.4)
Cholesterol, Total: 212 mg/dL — ABNORMAL HIGH (ref 100–199)
HDL: 49 mg/dL (ref >39)
LDL Calculated: 138 mg/dL — ABNORMAL HIGH (ref 0–99)
Triglycerides: 124 mg/dL (ref 0–149)
VLDL Cholesterol Cal: 25 mg/dL (ref 5–40)

## 2019-03-07 DIAGNOSIS — L639 Alopecia areata, unspecified: Secondary | ICD-10-CM | POA: Diagnosis not present

## 2019-04-19 ENCOUNTER — Encounter: Payer: Self-pay | Admitting: Obstetrics and Gynecology

## 2019-04-19 DIAGNOSIS — Z1231 Encounter for screening mammogram for malignant neoplasm of breast: Secondary | ICD-10-CM | POA: Diagnosis not present

## 2019-04-23 NOTE — Progress Notes (Deleted)
43 y.o. G67P4 Single White or Caucasian Hispanic or Latino female here for annual exam.      No LMP recorded. (Menstrual status: IUD).          Sexually active: {yes no:314532}  The current method of family planning is {contraception:315051}.    Exercising: {yes no:314532}  {types:19826} Smoker:  {YES V2345720  Health Maintenance: Pap:  03/24/2016 normal, neg HR HPV, 08/16/12 WNL, neg HR HPV History of abnormal Pap:  Yes more than 10 years ago per patient  MMG:  03/10/2018 BIRAD 1 Negative Colonoscopy:  n/a BMD:   n/a TDaP:  12/30/2017 Gardasil: no   reports that she has never smoked. She has never used smokeless tobacco. She reports that she does not drink alcohol or use drugs.  Past Medical History:  Diagnosis Date  . Anemia   . Prediabetes     Past Surgical History:  Procedure Laterality Date  . BREAST BIOPSY  2000   benign  . TUBAL LIGATION  2004   postpartum    Current Outpatient Medications  Medication Sig Dispense Refill  . levonorgestrel (MIRENA) 20 MCG/24HR IUD 1 Intra Uterine Device (1 each total) by Intrauterine route once. 1 Intra Uterine Device 0  . terbinafine (LAMISIL) 1 % cream Apply 1 application topically 2 (two) times daily. 42 g 0  . terbinafine (LAMISIL) 250 MG tablet Take 250 mg daily for 3 days and then once a week for 3 more doses 6 tablet 0   No current facility-administered medications for this visit.     Family History  Problem Relation Age of Onset  . Diabetes Maternal Grandmother   . Congestive Heart Failure Maternal Grandmother   . Depression Mother   . Breast cancer Cousin 41       paternal first cousin  . Colon cancer Neg Hx     Review of Systems  Exam:   There were no vitals taken for this visit.  Weight change: @WEIGHTCHANGE @ Height:      Ht Readings from Last 3 Encounters:  01/16/19 5' 2.5" (1.588 m)  01/02/19 5' 2.5" (1.588 m)  04/12/18 5' 2.5" (1.588 m)    General appearance: alert, cooperative and appears stated  age Head: Normocephalic, without obvious abnormality, atraumatic Neck: no adenopathy, supple, symmetrical, trachea midline and thyroid {CHL AMB PHY EX THYROID NORM DEFAULT:[id number]::"normal to inspection and palpation"} Lungs: clear to auscultation bilaterally Cardiovascular: regular rate and rhythm Breasts: {Exam; breast:13139::"normal appearance, no masses or tenderness"} Abdomen: soft, non-tender; non distended,  no masses,  no organomegaly Extremities: extremities normal, atraumatic, no cyanosis or edema Skin: Skin color, texture, turgor normal. No rashes or lesions Lymph nodes: Cervical, supraclavicular, and axillary nodes normal. No abnormal inguinal nodes palpated Neurologic: Grossly normal   Pelvic: External genitalia:  no lesions              Urethra:  normal appearing urethra with no masses, tenderness or lesions              Bartholins and Skenes: normal                 Vagina: normal appearing vagina with normal color and discharge, no lesions              Cervix: {CHL AMB PHY EX CERVIX NORM DEFAULT:[phone]::"no lesions"}               Bimanual Exam:  Uterus:  {CHL AMB PHY EX UTERUS NORM DEFAULT:[phone]::"normal size, contour, position, consistency, mobility, non-tender"}  Adnexa: {CHL AMB PHY EX ADNEXA NO MASS DEFAULT:[phone]::"no mass, fullness, tenderness"}               Rectovaginal: Confirms               Anus:  normal sphincter tone, no lesions  Chaperone was present for exam.  A:  Well Woman with normal exam  P:

## 2019-04-25 ENCOUNTER — Ambulatory Visit: Payer: 59 | Admitting: Obstetrics and Gynecology

## 2019-05-10 ENCOUNTER — Ambulatory Visit: Payer: 59 | Admitting: Dietician

## 2019-05-10 ENCOUNTER — Encounter

## 2019-05-21 NOTE — Progress Notes (Signed)
43 y.o. G62P4 Single White or Caucasian Hispanic or Latino female here for annual exam.  She has a mirena IUD, placed in 11/16.  Same partner, he is a truck driver and gone a lot. No dyspareunia.  No regular cycles, occasional light bleeding.     She had a patch of hair loss, has had a high normal TSH. Saw a dermatologist, hair was growing back.   She has a h/o a vit d def, was taking vit d, but stopped it in April, 2020.   No LMP recorded. (Menstrual status: IUD).          Sexually active: Yes.    The current method of family planning is IUD.    Exercising: Yes.    walking, work is physicial activity Smoker:  no  Health Maintenance: Pap:  03/24/2016 normal, neg HR HPV, 08/16/12 WNL, neg HR HPV History of abnormal Pap:  Yes more than 10 years ago per patient  MMG:  04/19/2019 Birads 1 negative Colonoscopy:  n/a BMD:   n/a TDaP:  12/30/2017 Gardasil: No   reports that she has never smoked. She has never used smokeless tobacco. She reports that she does not drink alcohol or use drugs. 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 She is working 60-70 hours a week. She is planning on getting her BSN, will be done in 2022.  Kids are 24 through 72. Only 43 year old daughter at home, in 15 th grade.   Past Medical History:  Diagnosis Date  . Anemia   . Prediabetes     Past Surgical History:  Procedure Laterality Date  . BREAST BIOPSY  2000   benign  . TUBAL LIGATION  2004   postpartum    Current Outpatient Medications  Medication Sig Dispense Refill  . levonorgestrel (MIRENA) 20 MCG/24HR IUD 1 Intra Uterine Device (1 each total) by Intrauterine route once. 1 Intra Uterine Device 0   No current facility-administered medications for this visit.     Family History  Problem Relation Age of Onset  . Diabetes Maternal Grandmother   . Congestive Heart Failure Maternal Grandmother   . Depression Mother   . Stroke Mother 78   . Breast cancer Cousin 74       paternal first cousin  . Colon cancer Neg Hx     Review of Systems  Constitutional: Negative.   HENT: Negative.   Eyes: Negative.   Respiratory: Negative.   Cardiovascular: Negative.   Gastrointestinal: Negative.   Endocrine: Negative.   Genitourinary: Negative.   Musculoskeletal: Positive for back pain.  Skin:       Hair loss  Allergic/Immunologic: Negative.   Neurological: Negative.   Hematological: Negative.   Psychiatric/Behavioral: Negative.     Exam:   BP 110/76 (BP Location: Right Arm, Patient Position: Sitting, Cuff Size: Normal)   Pulse 68   Temp 97.9 F (36.6 C) (Skin)   Ht 5\' 3"  (1.6 m)   Wt 254 lb 6.4 oz (115.4 kg)   BMI 45.06 kg/m   Weight change: @WEIGHTCHANGE @ Height:   Height: 5\' 3"  (160 cm)  Ht Readings from Last 3 Encounters:  05/24/19 5\' 3"  (1.6 m)  01/16/19 5' 2.5" (1.588 m)  01/02/19 5' 2.5" (1.588 m)    General appearance: alert, cooperative and appears stated age Head: Normocephalic, without obvious abnormality, atraumatic Neck: no adenopathy, supple, symmetrical, trachea midline and thyroid normal to  inspection and palpation Lungs: clear to auscultation bilaterally Cardiovascular: regular rate and rhythm Breasts: normal appearance, no masses or tenderness Abdomen: soft, non-tender; non distended,  no masses,  no organomegaly Extremities: extremities normal, atraumatic, no cyanosis or edema Skin: Skin color, texture, turgor normal. No rashes or lesions Lymph nodes: Cervical, supraclavicular, and axillary nodes normal. No abnormal inguinal nodes palpated Neurologic: Grossly normal   Pelvic: External genitalia:  no lesions              Urethra:  normal appearing urethra with no masses, tenderness or lesions              Bartholins and Skenes: normal                 Vagina: normal appearing vagina with normal color and discharge, no lesions              Cervix: no lesions and IUD string 1-2 cm                Bimanual Exam:  Uterus:  no masses or tenderness, anteverted uterus. Exam limited by BMI.              Adnexa: no mass, fullness, tenderness               Rectovaginal: Confirms               Anus:  normal sphincter tone, no lesions  Chaperone was present for exam.  A:  Well Woman with normal exam  Vit d def  Prediabetes  Hair loss, last TSH was high normal  IUD check, on for cycle control (h/o anemia)  P:   No pap this year  Mammogram UTD  Vit D, HgbA1C, TSH  Discussed breast self exam  Discussed calcium and vit D intake  STD testing  Due for new IUD in 11/21  Discussed weight loss and weight loss clinic

## 2019-05-24 ENCOUNTER — Other Ambulatory Visit: Payer: Self-pay

## 2019-05-24 ENCOUNTER — Encounter: Payer: Self-pay | Admitting: Obstetrics and Gynecology

## 2019-05-24 ENCOUNTER — Ambulatory Visit (INDEPENDENT_AMBULATORY_CARE_PROVIDER_SITE_OTHER): Payer: 59 | Admitting: Obstetrics and Gynecology

## 2019-05-24 VITALS — BP 110/76 | HR 68 | Temp 97.9°F | Ht 63.0 in | Wt 254.4 lb

## 2019-05-24 DIAGNOSIS — Z113 Encounter for screening for infections with a predominantly sexual mode of transmission: Secondary | ICD-10-CM | POA: Diagnosis not present

## 2019-05-24 DIAGNOSIS — Z01419 Encounter for gynecological examination (general) (routine) without abnormal findings: Secondary | ICD-10-CM | POA: Diagnosis not present

## 2019-05-24 DIAGNOSIS — Z30431 Encounter for routine checking of intrauterine contraceptive device: Secondary | ICD-10-CM | POA: Diagnosis not present

## 2019-05-24 DIAGNOSIS — E559 Vitamin D deficiency, unspecified: Secondary | ICD-10-CM | POA: Diagnosis not present

## 2019-05-24 DIAGNOSIS — Z6841 Body Mass Index (BMI) 40.0 and over, adult: Secondary | ICD-10-CM | POA: Diagnosis not present

## 2019-05-24 DIAGNOSIS — R7303 Prediabetes: Secondary | ICD-10-CM | POA: Diagnosis not present

## 2019-05-24 DIAGNOSIS — L659 Nonscarring hair loss, unspecified: Secondary | ICD-10-CM | POA: Diagnosis not present

## 2019-05-24 NOTE — Patient Instructions (Signed)
EXERCISE AND DIET:  We recommended that you start or continue a regular exercise program for good health. Regular exercise means any activity that makes your heart beat faster and makes you sweat.  We recommend exercising at least 30 minutes per day at least 3 days a week, preferably 4 or 5.  We also recommend a diet low in fat and sugar.  Inactivity, poor dietary choices and obesity can cause diabetes, heart attack, stroke, and kidney damage, among others.    ALCOHOL AND SMOKING:  Women should limit their alcohol intake to no more than 7 drinks/beers/glasses of wine (combined, not each!) per week. Moderation of alcohol intake to this level decreases your risk of breast cancer and liver damage. And of course, no recreational drugs are part of a healthy lifestyle.  And absolutely no smoking or even second hand smoke. Most people know smoking can cause heart and lung diseases, but did you know it also contributes to weakening of your bones? Aging of your skin?  Yellowing of your teeth and nails?  CALCIUM AND VITAMIN D:  Adequate intake of calcium and Vitamin D are recommended.  The recommendations for exact amounts of these supplements seem to change often, but generally speaking 1,000 mg of calcium (between diet and supplement) and 800 units of Vitamin D per day seems prudent. Certain women may benefit from higher intake of Vitamin D.  If you are among these women, your doctor will have told you during your visit.    PAP SMEARS:  Pap smears, to check for cervical cancer or precancers,  have traditionally been done yearly, although recent scientific advances have shown that most women can have pap smears less often.  However, every woman still should have a physical exam from her gynecologist every year. It will include a breast check, inspection of the vulva and vagina to check for abnormal growths or skin changes, a visual exam of the cervix, and then an exam to evaluate the size and shape of the uterus and  ovaries.  And after 43 years of age, a rectal exam is indicated to check for rectal cancers. We will also provide age appropriate advice regarding health maintenance, like when you should have certain vaccines, screening for sexually transmitted diseases, bone density testing, colonoscopy, mammograms, etc.   MAMMOGRAMS:  All women over 40 years old should have a yearly mammogram. Many facilities now offer a "3D" mammogram, which may cost around $50 extra out of pocket. If possible,  we recommend you accept the option to have the 3D mammogram performed.  It both reduces the number of women who will be called back for extra views which then turn out to be normal, and it is better than the routine mammogram at detecting truly abnormal areas.    COLON CANCER SCREENING: Now recommend starting at age 45. At this time colonoscopy is not covered for routine screening until 50. There are take home tests that can be done between 45-49.   COLONOSCOPY:  Colonoscopy to screen for colon cancer is recommended for all women at age 50.  We know, you hate the idea of the prep.  We agree, BUT, having colon cancer and not knowing it is worse!!  Colon cancer so often starts as a polyp that can be seen and removed at colonscopy, which can quite literally save your life!  And if your first colonoscopy is normal and you have no family history of colon cancer, most women don't have to have it again for   10 years.  Once every ten years, you can do something that may end up saving your life, right?  We will be happy to help you get it scheduled when you are ready.  Be sure to check your insurance coverage so you understand how much it will cost.  It may be covered as a preventative service at no cost, but you should check your particular policy.      Breast Self-Awareness Breast self-awareness means being familiar with how your breasts look and feel. It involves checking your breasts regularly and reporting any changes to your  health care provider. Practicing breast self-awareness is important. A change in your breasts can be a sign of a serious medical problem. Being familiar with how your breasts look and feel allows you to find any problems early, when treatment is more likely to be successful. All women should practice breast self-awareness, including women who have had breast implants. How to do a breast self-exam One way to learn what is normal for your breasts and whether your breasts are changing is to do a breast self-exam. To do a breast self-exam: Look for Changes  1. Remove all the clothing above your waist. 2. Stand in front of a mirror in a room with good lighting. 3. Put your hands on your hips. 4. Push your hands firmly downward. 5. Compare your breasts in the mirror. Look for differences between them (asymmetry), such as: ? Differences in shape. ? Differences in size. ? Puckers, dips, and bumps in one breast and not the other. 6. Look at each breast for changes in your skin, such as: ? Redness. ? Scaly areas. 7. Look for changes in your nipples, such as: ? Discharge. ? Bleeding. ? Dimpling. ? Redness. ? A change in position. Feel for Changes Carefully feel your breasts for lumps and changes. It is best to do this while lying on your back on the floor and again while sitting or standing in the shower or tub with soapy water on your skin. Feel each breast in the following way:  Place the arm on the side of the breast you are examining above your head.  Feel your breast with the other hand.  Start in the nipple area and make  inch (2 cm) overlapping circles to feel your breast. Use the pads of your three middle fingers to do this. Apply light pressure, then medium pressure, then firm pressure. The light pressure will allow you to feel the tissue closest to the skin. The medium pressure will allow you to feel the tissue that is a little deeper. The firm pressure will allow you to feel the tissue  close to the ribs.  Continue the overlapping circles, moving downward over the breast until you feel your ribs below your breast.  Move one finger-width toward the center of the body. Continue to use the  inch (2 cm) overlapping circles to feel your breast as you move slowly up toward your collarbone.  Continue the up and down exam using all three pressures until you reach your armpit.  Write Down What You Find  Write down what is normal for each breast and any changes that you find. Keep a written record with breast changes or normal findings for each breast. By writing this information down, you do not need to depend only on memory for size, tenderness, or location. Write down where you are in your menstrual cycle, if you are still menstruating. If you are having trouble noticing differences   in your breasts, do not get discouraged. With time you will become more familiar with the variations in your breasts and more comfortable with the exam. How often should I examine my breasts? Examine your breasts every month. If you are breastfeeding, the best time to examine your breasts is after a feeding or after using a breast pump. If you menstruate, the best time to examine your breasts is 5-7 days after your period is over. During your period, your breasts are lumpier, and it may be more difficult to notice changes. When should I see my health care provider? See your health care provider if you notice:  A change in shape or size of your breasts or nipples.  A change in the skin of your breast or nipples, such as a reddened or scaly area.  Unusual discharge from your nipples.  A lump or thick area that was not there before.  Pain in your breasts.  Anything that concerns you.  

## 2019-05-25 LAB — GC/CHLAMYDIA PROBE AMP
Chlamydia trachomatis, NAA: NEGATIVE
Neisseria Gonorrhoeae by PCR: NEGATIVE

## 2019-05-26 LAB — RPR: RPR Ser Ql: NONREACTIVE

## 2019-05-26 LAB — TSH: TSH: 4.36 u[IU]/mL (ref 0.450–4.500)

## 2019-05-26 LAB — HEMOGLOBIN A1C
Est. average glucose Bld gHb Est-mCnc: 117 mg/dL
Hgb A1c MFr Bld: 5.7 % — ABNORMAL HIGH (ref 4.8–5.6)

## 2019-05-26 LAB — VITAMIN D 25 HYDROXY (VIT D DEFICIENCY, FRACTURES): Vit D, 25-Hydroxy: 20.9 ng/mL — ABNORMAL LOW (ref 30.0–100.0)

## 2019-05-26 LAB — HIV ANTIBODY (ROUTINE TESTING W REFLEX): HIV Screen 4th Generation wRfx: NONREACTIVE

## 2019-05-28 ENCOUNTER — Telehealth: Payer: Self-pay

## 2019-05-28 DIAGNOSIS — E559 Vitamin D deficiency, unspecified: Secondary | ICD-10-CM

## 2019-05-28 DIAGNOSIS — R7303 Prediabetes: Secondary | ICD-10-CM

## 2019-05-28 NOTE — Telephone Encounter (Signed)
-----   Message from Salvadore Dom, MD sent at 05/28/2019  7:33 AM EDT ----- Please let the patient know that she still has prediabetes and refer her to the prediabetes clinic. Her vit d is low (she stopped her vit d 6 months ago). Please have her start 5,000 IU a a day of vit d 3, then have her return in 3 months for a repeat vit d level.  The rest of her lab work is normal.   CC: Evelina Dun, FNP

## 2019-05-28 NOTE — Telephone Encounter (Signed)
Left message to call Ravenne Wayment at [phone]. 

## 2019-05-29 NOTE — Telephone Encounter (Signed)
Spoke with patient. Results given. Patient verbalizes understanding. Patient would like referral to prediabetes clinic. Referral placed. Patient is aware she will be contacted directly to schedule. 3 month vitamin d recheck scheduled for 08/29/2018 at 8:45 am. Patient is agreeable to date and time. Encounter closed.

## 2019-07-10 DIAGNOSIS — M7542 Impingement syndrome of left shoulder: Secondary | ICD-10-CM | POA: Diagnosis not present

## 2019-07-10 DIAGNOSIS — M25512 Pain in left shoulder: Secondary | ICD-10-CM | POA: Diagnosis not present

## 2019-07-12 ENCOUNTER — Encounter: Payer: 59 | Attending: Obstetrics and Gynecology | Admitting: Nutrition

## 2019-07-12 ENCOUNTER — Other Ambulatory Visit: Payer: Self-pay

## 2019-07-12 VITALS — Ht 63.0 in | Wt 255.0 lb

## 2019-07-12 DIAGNOSIS — R7303 Prediabetes: Secondary | ICD-10-CM | POA: Insufficient documentation

## 2019-07-12 DIAGNOSIS — Z6841 Body Mass Index (BMI) 40.0 and over, adult: Secondary | ICD-10-CM | POA: Diagnosis not present

## 2019-07-12 NOTE — Progress Notes (Signed)
.   Medical Nutrition Therapy:  Appt start time: [id number] end time:  1630.   Assessment:  Primary concerns today: pre DM and obesity. MGM has Dm.  Eats 2 meals per day.  Current weight is stable. 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 PreDM. A1C 5.7%. Wants to lose weight.  Willing to work on eating habits. Emotional eater. Willing to work on exercise.  Lab Results  Component Value Date   HGBA1C 5.7 (H) 05/24/2019    Preferred Learning Style:   No preference indicated    Learning Readiness:    Ready  Change in progress   MEDICATIONS:    DIETARY INTAKE:     Eats 2 meals per day. Tends to eat later at night and skips breakfast.   Usual physical activity: ADL  Estimated energy needs: 1200-1500  calories 135 g carbohydrates 90 g protein 33 g fat  Progress Towards Goal(s):  In progress.   Nutritional Diagnosis:  NI-1.5 Excessive energy intake As related to eating inconsistently and higher fat higher salt foods.  As evidenced by A1C 5.7 and BMI > 40..    Intervention:  Nutrition and  PRE-Diabetes education provided on My Plate, CHO counting, meal planning, portion sizes, timing of meals, avoiding snacks between meals unless having a low blood sugar, target ranges for A1C and blood sugars, signs/symptoms and treatment of hyper/hypoglycemia, monitoring blood sugars, taking medications as prescribed, benefits of exercising 60 minutes per day and prevention of DM. Marland Kitchen Goals Follow MY Plate Eat 075-GRM grams of carbs per meal Try using Myfitness pal app or other to track food, exercise and liquids. Drink only water  Don't skip meals Increase lower carb vegetables with lunch and dinner Prep meals Lose 1-2 lbs per week   Teaching Method Utilized:  Visual Auditory Hands on  Handouts given during visit include:  The Plate Method   Meal Plan  Pre DM instructions   Barriers to learning/adherence to lifestyle change: none  Demonstrated degree of understanding  via:  Teach Back   Monitoring/Evaluation:  Dietary intake, exercise,  and body weight in 2 weeks.

## 2019-07-20 ENCOUNTER — Other Ambulatory Visit (HOSPITAL_COMMUNITY): Payer: Self-pay | Admitting: Physician Assistant

## 2019-07-20 ENCOUNTER — Other Ambulatory Visit: Payer: Self-pay | Admitting: Physician Assistant

## 2019-07-20 DIAGNOSIS — M25512 Pain in left shoulder: Secondary | ICD-10-CM

## 2019-07-25 ENCOUNTER — Encounter: Payer: Self-pay | Admitting: Nutrition

## 2019-07-25 ENCOUNTER — Encounter: Payer: 59 | Admitting: Nutrition

## 2019-07-25 DIAGNOSIS — Z6841 Body Mass Index (BMI) 40.0 and over, adult: Secondary | ICD-10-CM

## 2019-07-25 DIAGNOSIS — R7303 Prediabetes: Secondary | ICD-10-CM

## 2019-07-25 NOTE — Patient Instructions (Signed)
Goals Follow MY Plate Eat three meals per day at times discussed. Drink water Cut out snacks Don't skip meals Increase fresh fruits and vegetables Lose 1-2 lbs per week

## 2019-07-25 NOTE — Progress Notes (Signed)
.   Medical Nutrition Therapy:  Appt start time: [id number] end time:  1630.   Assessment:  Primary concerns today: pre DM and obesity. MGM has Dm.  Eats 2 meals per day. Working on getting back on track with meals and timing of meals. Wants to exercise. Did well for awhile after last visit. Was logging foods. Current weight is stable. She wants to lose weight. Wants to prevent diabetes. 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 Motivated to make changes with diet and exercise to lose weight and reduce impaired fasting glucose.   Lab Results  Component Value Date   HGBA1C 5.7 (H) 05/24/2019   CMP Latest Ref Rng & Units 02/05/2019 12/30/2017 03/31/2017  Glucose 65 - 99 mg/dL 84 82 87  BUN 6 - 24 mg/dL 14 10 15   Creatinine 0.57 - 1.00 mg/dL 0.78 0.65 0.75  Sodium 134 - 144 mmol/L 140 139 139  Potassium 3.5 - 5.2 mmol/L 4.3 4.3 4.0  Chloride 96 - 106 mmol/L 103 102 101  CO2 20 - 29 mmol/L 24 25 24   Calcium 8.7 - 10.2 mg/dL 9.1 9.4 9.3  Total Protein 6.0 - 8.5 g/dL 6.5 6.5 6.7  Total Bilirubin 0.0 - 1.2 mg/dL 0.3 0.4 0.4  Alkaline Phos 39 - 117 IU/L 76 67 75  AST 0 - 40 IU/L 19 15 16   ALT 0 - 32 IU/L 16 13 15    Lipid Panel     Component Value Date/Time   CHOL 212 (H) 02/05/2019 1153   TRIG 124 02/05/2019 1153   HDL 49 02/05/2019 1153   CHOLHDL 4.3 02/05/2019 1153   CHOLHDL 4.3 02/13/2015 0945   VLDL 30 02/13/2015 0945   LDLCALC 138 (H) 02/05/2019 1153   LABVLDL 25 02/05/2019 1153     Preferred Learning Style:    No preference indicated   Learning Readiness:     Ready  Change in progress   MEDICATIONS:   DIETARY INTAKE:   Eat 2-3 meals per day.  Usual physical activity:  Walks some  Estimated energy needs: 1500  calories 170 g carbohydrates 112 g protein 42 g fat  Progress Towards Goal(s):  In progress.   Nutritional Diagnosis:  NB-1.1 Food and nutrition-related knowledge deficit As related to Pre Dm and Obesity.  As  evidenced by A1C 5.7% and BI > 30.    Intervention:  Nutrition and  Pre-Diabetes education provided on My Plate, CHO counting, meal planning, portion sizes, timing of meals, avoiding snacks between meals unless having a low blood sugar, target ranges for A1C and blood sugars, signs/symptoms and treatment of hyper/hypoglycemia, monitoring blood sugars, taking medications as prescribed, benefits of exercising 30 minutes per day and prevention of complications of DM.  Goals Follow MY Plate Eat three meals per day at times discussed. Drink water Cut out snacks Don't skip meals Increase fresh fruits and vegetables Lose 1-2 lbs per week   Teaching Method Utilized:  Visual Auditory Hands on  Handouts given during visit include:  The Plate Method  Diabetes Instructions  30 g CHO meal plans   Barriers to learning/adherence to lifestyle change: none  Demonstrated degree of understanding via:  Teach Back   Monitoring/Evaluation:  Dietary intake, exercise,  and body weight in 1 month(s).

## 2019-07-25 NOTE — Patient Instructions (Signed)
.   Goals Follow MY Plate Eat 92-11 grams of carbs per meal Try using Myfitness pal app or other to track food, exercise and liquids. Drink only water  Don't skip meals Increase lower carb vegetables with lunch and dinner Prep meals Lose 1-2 lbs per week

## 2019-07-27 ENCOUNTER — Inpatient Hospital Stay (HOSPITAL_BASED_OUTPATIENT_CLINIC_OR_DEPARTMENT_OTHER)
Admission: EM | Admit: 2019-07-27 | Discharge: 2019-07-29 | DRG: 418 | Disposition: A | Discharge status: Home / Self Care | Payer: 59 | Attending: Surgery | Admitting: Surgery

## 2019-07-27 ENCOUNTER — Encounter (HOSPITAL_BASED_OUTPATIENT_CLINIC_OR_DEPARTMENT_OTHER): Payer: Self-pay | Admitting: Emergency Medicine

## 2019-07-27 ENCOUNTER — Emergency Department (HOSPITAL_BASED_OUTPATIENT_CLINIC_OR_DEPARTMENT_OTHER): Payer: 59

## 2019-07-27 ENCOUNTER — Other Ambulatory Visit: Payer: Self-pay

## 2019-07-27 ENCOUNTER — Telehealth: Payer: Self-pay | Admitting: Obstetrics and Gynecology

## 2019-07-27 ENCOUNTER — Encounter (HOSPITAL_COMMUNITY): Payer: Self-pay

## 2019-07-27 ENCOUNTER — Ambulatory Visit (HOSPITAL_COMMUNITY): Admission: RE | Admit: 2019-07-27 | Payer: 59 | Source: Ambulatory Visit

## 2019-07-27 DIAGNOSIS — Z818 Family history of other mental and behavioral disorders: Secondary | ICD-10-CM | POA: Diagnosis not present

## 2019-07-27 DIAGNOSIS — K8020 Calculus of gallbladder without cholecystitis without obstruction: Secondary | ICD-10-CM | POA: Diagnosis not present

## 2019-07-27 DIAGNOSIS — R9389 Abnormal findings on diagnostic imaging of other specified body structures: Secondary | ICD-10-CM | POA: Diagnosis present

## 2019-07-27 DIAGNOSIS — Z8249 Family history of ischemic heart disease and other diseases of the circulatory system: Secondary | ICD-10-CM

## 2019-07-27 DIAGNOSIS — R7303 Prediabetes: Secondary | ICD-10-CM | POA: Diagnosis present

## 2019-07-27 DIAGNOSIS — M25512 Pain in left shoulder: Secondary | ICD-10-CM | POA: Diagnosis present

## 2019-07-27 DIAGNOSIS — K810 Acute cholecystitis: Secondary | ICD-10-CM

## 2019-07-27 DIAGNOSIS — Z9851 Tubal ligation status: Secondary | ICD-10-CM

## 2019-07-27 DIAGNOSIS — M722 Plantar fascial fibromatosis: Secondary | ICD-10-CM | POA: Diagnosis not present

## 2019-07-27 DIAGNOSIS — K8012 Calculus of gallbladder with acute and chronic cholecystitis without obstruction: Secondary | ICD-10-CM | POA: Diagnosis not present

## 2019-07-27 DIAGNOSIS — R109 Unspecified abdominal pain: Secondary | ICD-10-CM | POA: Diagnosis not present

## 2019-07-27 DIAGNOSIS — Z975 Presence of (intrauterine) contraceptive device: Secondary | ICD-10-CM

## 2019-07-27 DIAGNOSIS — K7581 Nonalcoholic steatohepatitis (NASH): Secondary | ICD-10-CM | POA: Diagnosis not present

## 2019-07-27 DIAGNOSIS — Z823 Family history of stroke: Secondary | ICD-10-CM

## 2019-07-27 DIAGNOSIS — R197 Diarrhea, unspecified: Secondary | ICD-10-CM | POA: Diagnosis not present

## 2019-07-27 DIAGNOSIS — Z6841 Body Mass Index (BMI) 40.0 and over, adult: Secondary | ICD-10-CM

## 2019-07-27 DIAGNOSIS — Z833 Family history of diabetes mellitus: Secondary | ICD-10-CM | POA: Diagnosis not present

## 2019-07-27 DIAGNOSIS — Z803 Family history of malignant neoplasm of breast: Secondary | ICD-10-CM | POA: Diagnosis not present

## 2019-07-27 DIAGNOSIS — Z20828 Contact with and (suspected) exposure to other viral communicable diseases: Secondary | ICD-10-CM | POA: Diagnosis present

## 2019-07-27 DIAGNOSIS — K8010 Calculus of gallbladder with chronic cholecystitis without obstruction: Secondary | ICD-10-CM | POA: Diagnosis not present

## 2019-07-27 DIAGNOSIS — R1011 Right upper quadrant pain: Secondary | ICD-10-CM | POA: Diagnosis present

## 2019-07-27 DIAGNOSIS — K8000 Calculus of gallbladder with acute cholecystitis without obstruction: Secondary | ICD-10-CM | POA: Diagnosis present

## 2019-07-27 DIAGNOSIS — D649 Anemia, unspecified: Secondary | ICD-10-CM | POA: Diagnosis not present

## 2019-07-27 LAB — URINALYSIS, ROUTINE W REFLEX MICROSCOPIC
Bilirubin Urine: NEGATIVE
Glucose, UA: NEGATIVE mg/dL
Hgb urine dipstick: NEGATIVE
Ketones, ur: NEGATIVE mg/dL
Leukocytes,Ua: NEGATIVE
Nitrite: NEGATIVE
Protein, ur: NEGATIVE mg/dL
Specific Gravity, Urine: 1.025 (ref 1.005–1.030)
pH: 5.5 (ref 5.0–8.0)

## 2019-07-27 LAB — COMPREHENSIVE METABOLIC PANEL
ALT: 18 U/L (ref 0–44)
AST: 18 U/L (ref 15–41)
Albumin: 3.6 g/dL (ref 3.5–5.0)
Alkaline Phosphatase: 60 U/L (ref 38–126)
Anion gap: 7 (ref 5–15)
BUN: 15 mg/dL (ref 6–20)
CO2: 25 mmol/L (ref 22–32)
Calcium: 9.6 mg/dL (ref 8.9–10.3)
Chloride: 105 mmol/L (ref 98–111)
Creatinine, Ser: 0.72 mg/dL (ref 0.44–1.00)
GFR calc Af Amer: >60 mL/min (ref >60)
GFR calc non Af Amer: >60 mL/min (ref >60)
Glucose, Bld: 105 mg/dL — ABNORMAL HIGH (ref 70–99)
Potassium: 4.6 mmol/L (ref 3.5–5.1)
Sodium: 137 mmol/L (ref 135–145)
Total Bilirubin: 0.4 mg/dL (ref 0.3–1.2)
Total Protein: 7.1 g/dL (ref 6.5–8.1)

## 2019-07-27 LAB — CBC WITH DIFFERENTIAL/PLATELET
Abs Immature Granulocytes: 0.07 10*3/uL (ref 0.00–0.07)
Basophils Absolute: 0.1 10*3/uL (ref 0.0–0.1)
Basophils Relative: 1 %
Eosinophils Absolute: 0.2 10*3/uL (ref 0.0–0.5)
Eosinophils Relative: 2 %
HCT: 37.7 % (ref 36.0–46.0)
Hemoglobin: 12.1 g/dL (ref 12.0–15.0)
Immature Granulocytes: 1 %
Lymphocytes Relative: 32 %
Lymphs Abs: 2.6 10*3/uL (ref 0.7–4.0)
MCH: 28.8 pg (ref 26.0–34.0)
MCHC: 32.1 g/dL (ref 30.0–36.0)
MCV: 89.8 fL (ref 80.0–100.0)
Monocytes Absolute: 0.7 10*3/uL (ref 0.1–1.0)
Monocytes Relative: 8 %
Neutro Abs: 4.6 10*3/uL (ref 1.7–7.7)
Neutrophils Relative %: 56 %
Platelets: 227 10*3/uL (ref 150–400)
RBC: 4.2 MIL/uL (ref 3.87–5.11)
RDW: 13.6 % (ref 11.5–15.5)
WBC: 8.2 10*3/uL (ref 4.0–10.5)
nRBC: 0 % (ref 0.0–0.2)

## 2019-07-27 LAB — SARS CORONAVIRUS 2 (TAT 6-24 HRS): SARS Coronavirus 2: NEGATIVE

## 2019-07-27 LAB — LIPASE, BLOOD: Lipase: 35 U/L (ref 11–51)

## 2019-07-27 LAB — PREGNANCY, URINE: Preg Test, Ur: NEGATIVE

## 2019-07-27 LAB — LACTIC ACID, PLASMA: Lactic Acid, Venous: 0.8 mmol/L (ref 0.5–1.9)

## 2019-07-27 MED ORDER — ONDANSETRON HCL 4 MG/2ML IJ SOLN
4.0000 mg | Freq: Four times a day (QID) | INTRAMUSCULAR | Status: DC | PRN
  Administered 2019-07-27: 4 mg via INTRAVENOUS
  Filled 2019-07-27: qty 2

## 2019-07-27 MED ORDER — BUPIVACAINE LIPOSOME 1.3 % IJ SUSP
20.0000 mL | Freq: Once | INTRAMUSCULAR | Status: DC
  Filled 2019-07-27: qty 20

## 2019-07-27 MED ORDER — GABAPENTIN 300 MG PO CAPS: 300.0000 mg | ORAL_CAPSULE | ORAL | Status: DC

## 2019-07-27 MED ORDER — ACETAMINOPHEN 325 MG PO TABS
650.0000 mg | ORAL_TABLET | Freq: Once | ORAL | Status: AC
  Administered 2019-07-27: 650 mg via ORAL
  Filled 2019-07-27: qty 2

## 2019-07-27 MED ORDER — LIP MEDEX EX OINT
1.0000 "application " | TOPICAL_OINTMENT | Freq: Two times a day (BID) | CUTANEOUS | Status: DC
  Administered 2019-07-27: 1 via TOPICAL
  Filled 2019-07-27: qty 7

## 2019-07-27 MED ORDER — HYDROMORPHONE HCL 1 MG/ML IJ SOLN
0.5000 mg | INTRAMUSCULAR | Status: DC | PRN
  Administered 2019-07-27 – 2019-07-28 (×3): 1 mg via INTRAVENOUS
  Filled 2019-07-27 (×4): qty 1

## 2019-07-27 MED ORDER — SIMETHICONE 80 MG PO CHEW: 40.0000 mg | CHEWABLE_TABLET | Freq: Four times a day (QID) | ORAL | Status: DC | PRN

## 2019-07-27 MED ORDER — METOCLOPRAMIDE HCL 5 MG/ML IJ SOLN
10.0000 mg | Freq: Once | INTRAMUSCULAR | Status: AC
  Administered 2019-07-27: 10 mg via INTRAVENOUS
  Filled 2019-07-27: qty 2

## 2019-07-27 MED ORDER — ACETAMINOPHEN 650 MG RE SUPP: 650.0000 mg | Freq: Four times a day (QID) | RECTAL | Status: DC | PRN

## 2019-07-27 MED ORDER — FAMOTIDINE IN NACL 20-0.9 MG/50ML-% IV SOLN
20.0000 mg | Freq: Two times a day (BID) | INTRAVENOUS | Status: DC
  Administered 2019-07-27 (×2): 20 mg via INTRAVENOUS
  Filled 2019-07-27 (×2): qty 50

## 2019-07-27 MED ORDER — METOPROLOL TARTRATE 5 MG/5ML IV SOLN: 5.0000 mg | Freq: Four times a day (QID) | INTRAVENOUS | Status: DC | PRN

## 2019-07-27 MED ORDER — LACTATED RINGERS IV SOLN: INTRAVENOUS | Status: AC

## 2019-07-27 MED ORDER — CELECOXIB 200 MG PO CAPS: 400.0000 mg | ORAL_CAPSULE | ORAL | Status: DC

## 2019-07-27 MED ORDER — LACTATED RINGERS IV SOLN: INTRAVENOUS | Status: DC

## 2019-07-27 MED ORDER — SODIUM CHLORIDE 0.9 % IV SOLN
INTRAVENOUS | Status: DC | PRN
  Administered 2019-07-27: 1000 mL via INTRAVENOUS

## 2019-07-27 MED ORDER — METRONIDAZOLE IN NACL 5-0.79 MG/ML-% IV SOLN: 500.0000 mg | INTRAVENOUS | Status: DC

## 2019-07-27 MED ORDER — DIPHENHYDRAMINE HCL 50 MG/ML IJ SOLN
12.5000 mg | Freq: Once | INTRAMUSCULAR | Status: AC
  Administered 2019-07-27: 17:00:00 12.5 mg via INTRAVENOUS
  Filled 2019-07-27: qty 1

## 2019-07-27 MED ORDER — SODIUM CHLORIDE 0.9 % IV SOLN
8.0000 mg | Freq: Four times a day (QID) | INTRAVENOUS | Status: DC | PRN
  Filled 2019-07-27: qty 4

## 2019-07-27 MED ORDER — METHOCARBAMOL 1000 MG/10ML IJ SOLN
1000.0000 mg | Freq: Four times a day (QID) | INTRAVENOUS | Status: DC | PRN
  Filled 2019-07-27: qty 10

## 2019-07-27 MED ORDER — GUAIFENESIN-DM 100-10 MG/5ML PO SYRP: 10.0000 mL | ORAL_SOLUTION | ORAL | Status: DC | PRN

## 2019-07-27 MED ORDER — PHENOL 1.4 % MT LIQD: 1.0000 | OROMUCOSAL | Status: DC | PRN

## 2019-07-27 MED ORDER — PIPERACILLIN-TAZOBACTAM 3.375 G IVPB
3.3750 g | Freq: Three times a day (TID) | INTRAVENOUS | Status: DC
  Administered 2019-07-27: 3.375 g via INTRAVENOUS
  Filled 2019-07-27: qty 50

## 2019-07-27 MED ORDER — ENALAPRILAT 1.25 MG/ML IV SOLN
0.6250 mg | Freq: Four times a day (QID) | INTRAVENOUS | Status: DC | PRN
  Filled 2019-07-27: qty 1

## 2019-07-27 MED ORDER — MENTHOL 3 MG MT LOZG: 1.0000 | LOZENGE | OROMUCOSAL | Status: DC | PRN

## 2019-07-27 MED ORDER — PIPERACILLIN-TAZOBACTAM 3.375 G IVPB 30 MIN
3.3750 g | Freq: Three times a day (TID) | INTRAVENOUS | Status: DC
  Administered 2019-07-27: 3.375 g via INTRAVENOUS
  Filled 2019-07-27 (×2): qty 50

## 2019-07-27 MED ORDER — SODIUM CHLORIDE 0.9 % IV SOLN
1000.0000 mL | INTRAVENOUS | Status: DC
  Administered 2019-07-28 – 2019-07-29 (×3): 1000 mL via INTRAVENOUS

## 2019-07-27 MED ORDER — DIPHENHYDRAMINE HCL 50 MG/ML IJ SOLN: 12.5000 mg | Freq: Four times a day (QID) | INTRAMUSCULAR | Status: DC | PRN

## 2019-07-27 MED ORDER — BISACODYL 10 MG RE SUPP: 10.0000 mg | Freq: Two times a day (BID) | RECTAL | Status: DC | PRN

## 2019-07-27 MED ORDER — ACETAMINOPHEN 325 MG PO TABS: 325.0000 mg | ORAL_TABLET | Freq: Four times a day (QID) | ORAL | Status: DC | PRN

## 2019-07-27 MED ORDER — IOHEXOL 300 MG/ML  SOLN
100.0000 mL | Freq: Once | INTRAMUSCULAR | Status: AC
  Administered 2019-07-27: 07:00:00 100 mL via INTRAVENOUS

## 2019-07-27 MED ORDER — ACETAMINOPHEN 325 MG PO TABS
650.0000 mg | ORAL_TABLET | Freq: Four times a day (QID) | ORAL | Status: DC | PRN
  Administered 2019-07-29 (×2): 650 mg via ORAL
  Filled 2019-07-27 (×2): qty 2

## 2019-07-27 MED ORDER — SODIUM CHLORIDE 0.9 % IV SOLN
2.0000 g | INTRAVENOUS | Status: DC
  Administered 2019-07-27 – 2019-07-28 (×2): 2 g via INTRAVENOUS
  Filled 2019-07-27 (×2): qty 2

## 2019-07-27 MED ORDER — MORPHINE SULFATE (PF) 4 MG/ML IV SOLN
4.0000 mg | INTRAVENOUS | Status: DC | PRN
  Administered 2019-07-27: 09:00:00 4 mg via INTRAVENOUS
  Filled 2019-07-27: qty 1

## 2019-07-27 MED ORDER — ONDANSETRON HCL 4 MG/2ML IJ SOLN
INTRAMUSCULAR | Status: AC
  Administered 2019-07-27: 4 mg
  Filled 2019-07-27: qty 2

## 2019-07-27 MED ORDER — CHLORHEXIDINE GLUCONATE CLOTH 2 % EX PADS
6.0000 | MEDICATED_PAD | Freq: Once | CUTANEOUS | Status: AC
  Administered 2019-07-27: 6 via TOPICAL

## 2019-07-27 MED ORDER — DROPERIDOL 2.5 MG/ML IJ SOLN
1.2500 mg | Freq: Once | INTRAMUSCULAR | Status: AC
  Administered 2019-07-27: 1.25 mg via INTRAVENOUS
  Filled 2019-07-27: qty 2

## 2019-07-27 MED ORDER — LACTATED RINGERS IV BOLUS: 1000.0000 mL | Freq: Three times a day (TID) | INTRAVENOUS | Status: DC | PRN

## 2019-07-27 MED ORDER — DIPHENHYDRAMINE HCL 12.5 MG/5ML PO ELIX: 12.5000 mg | ORAL_SOLUTION | Freq: Four times a day (QID) | ORAL | Status: DC | PRN

## 2019-07-27 MED ORDER — HYDROCORTISONE 1 % EX CREA: 1.0000 "application " | TOPICAL_CREAM | Freq: Three times a day (TID) | CUTANEOUS | Status: DC | PRN

## 2019-07-27 MED ORDER — ALUM & MAG HYDROXIDE-SIMETH 200-200-20 MG/5ML PO SUSP: 30.0000 mL | Freq: Four times a day (QID) | ORAL | Status: DC | PRN

## 2019-07-27 MED ORDER — ONDANSETRON HCL 4 MG/2ML IJ SOLN
4.0000 mg | Freq: Four times a day (QID) | INTRAMUSCULAR | Status: DC | PRN
  Administered 2019-07-27 – 2019-07-28 (×2): 4 mg via INTRAVENOUS
  Filled 2019-07-27 (×3): qty 2

## 2019-07-27 MED ORDER — SODIUM CHLORIDE 0.9 % IV SOLN: Freq: Three times a day (TID) | INTRAVENOUS | Status: DC | PRN

## 2019-07-27 MED ORDER — OXYCODONE HCL 5 MG PO TABS
5.0000 mg | ORAL_TABLET | ORAL | Status: DC | PRN
  Administered 2019-07-28 – 2019-07-29 (×2): 5 mg via ORAL
  Filled 2019-07-27 (×2): qty 1

## 2019-07-27 MED ORDER — PROCHLORPERAZINE EDISYLATE 10 MG/2ML IJ SOLN
5.0000 mg | INTRAMUSCULAR | Status: DC | PRN
  Administered 2019-07-28: 10 mg via INTRAVENOUS
  Filled 2019-07-27: qty 2

## 2019-07-27 MED ORDER — ENOXAPARIN SODIUM 40 MG/0.4ML ~~LOC~~ SOLN
40.0000 mg | SUBCUTANEOUS | Status: DC
  Administered 2019-07-28: 20:00:00 40 mg via SUBCUTANEOUS
  Filled 2019-07-27: qty 0.4

## 2019-07-27 MED ORDER — HYDROCORTISONE (PERIANAL) 2.5 % EX CREA: 1.0000 "application " | TOPICAL_CREAM | Freq: Four times a day (QID) | CUTANEOUS | Status: DC | PRN

## 2019-07-27 MED ORDER — SODIUM CHLORIDE 0.9 % IV BOLUS (SEPSIS)
1000.0000 mL | Freq: Once | INTRAVENOUS | Status: AC
  Administered 2019-07-27: 1000 mL via INTRAVENOUS

## 2019-07-27 MED ORDER — MAGIC MOUTHWASH
15.0000 mL | Freq: Four times a day (QID) | ORAL | Status: DC | PRN
  Filled 2019-07-27: qty 15

## 2019-07-27 MED ORDER — FENTANYL CITRATE (PF) 100 MCG/2ML IJ SOLN
100.0000 ug | Freq: Once | INTRAMUSCULAR | Status: AC
  Administered 2019-07-27: 100 ug via INTRAVENOUS
  Filled 2019-07-27: qty 2

## 2019-07-27 MED ORDER — CHLORHEXIDINE GLUCONATE CLOTH 2 % EX PADS
6.0000 | MEDICATED_PAD | Freq: Once | CUTANEOUS | Status: AC
  Administered 2019-07-28: 6 via TOPICAL

## 2019-07-27 MED ORDER — ACETAMINOPHEN 500 MG PO TABS: 1000.0000 mg | ORAL_TABLET | ORAL | Status: DC

## 2019-07-27 MED ORDER — CEFAZOLIN SODIUM-DEXTROSE 2-4 GM/100ML-% IV SOLN: 2.0000 g | INTRAVENOUS | Status: DC

## 2019-07-27 NOTE — ED Notes (Signed)
Pt c/o nausea. EDP made aware

## 2019-07-27 NOTE — Telephone Encounter (Signed)
[place]  Patient HM Schedule Request Pool 2 hours ago (9:32 AM)   Appointment Request From: [name]  With Provider: Salvadore Dom, MD [facility]]  Preferred Date Range: Any date 07/27/2019 or later  Preferred Times: Any  Reason: To address the following health maintenance concerns. Pap Smear-Modifier  Comments: I had a CT scan today in the ED due to gallstones.  Radiologist mentioned that I should have a pap smear visible thickening of the cervix notes on CT.

## 2019-07-27 NOTE — H&P (Addendum)
Linda Shepherd is an 43 y.o. female.   Chief Complaint: Right upper quadrant abdominal pain HPI: 43 year old female who presented to [facility] overnight after developing acute onset of right upper quadrant pain.  She states that she ate dinner around 8 PM and then around 2 AM developed severe pain in her right upper abdomen.  She had never had anything like that before.  So she went to [place].  She was found to have early cholecystitis on CT.  She had normal labs.  She was still tender after several hours and so we were contacted for admission for cholecystectomy.  The patient ultimately got to [facility] almost 11 hours after acceptance.  She states that she has been having some loose stool for the past few weeks but that is been her only GI issue until last night.  She denies any fever, chills, nausea or vomiting.  She denies any melena hematochezia.  She was also found to have an area of abnormality in her cervix on CT imaging.  The ED physician discussed this with her.  She states that she already has made an appointment with her gynecologist Dr. Talbert Nan for follow-up.  She is only had a tubal ligation.  She has had IUD for a long time  She works as a Producer, television/film/video at [employer]  She denies smoking or significant alcohol use.  She denies any drug use.  She states that she does have some prediabetes  She has been having some left shoulder issues and thinks she has a pinched nerve she was in fact supposed to have an MRI this morning  Past Medical History:  Diagnosis Date  . Anemia   . Prediabetes     Past Surgical History:  Procedure Laterality Date  . BREAST BIOPSY  2000   benign  . TUBAL LIGATION  2004   postpartum    Family History  Problem Relation Age of Onset  . Diabetes Maternal Grandmother   . Congestive Heart Failure Maternal Grandmother   . Depression Mother   . Stroke Mother 54  . Breast cancer Cousin 59       paternal first  cousin  . Colon cancer Neg Hx    Social History:  reports that she has never smoked. She has never used smokeless tobacco. She reports that she does not drink alcohol or use drugs.  Allergies: No Known Allergies  Medications Prior to Admission  Medication Sig Dispense Refill  . levonorgestrel (MIRENA) 20 MCG/24HR IUD 1 Intra Uterine Device (1 each total) by Intrauterine route once. 1 Intra Uterine Device 0    Results for orders placed or performed during the hospital encounter of 07/27/19 (from the past 48 hour(s))  CBC with Differential     Status: None   Collection Time: 07/27/19  5:35 AM  Result Value Ref Range   WBC 8.2 4.0 - 10.5 K/uL   RBC 4.20 3.87 - 5.11 MIL/uL   Hemoglobin 12.1 12.0 - 15.0 g/dL   HCT 37.7 36.0 - 46.0 %   MCV 89.8 80.0 - 100.0 fL   MCH 28.8 26.0 - 34.0 pg   MCHC 32.1 30.0 - 36.0 g/dL   RDW 13.6 11.5 - 15.5 %   Platelets 227 150 - 400 K/uL   nRBC 0.0 0.0 - 0.2 %   Neutrophils Relative % 56 %   Neutro Abs 4.6 1.7 - 7.7 K/uL   Lymphocytes Relative 32 %   Lymphs Abs  2.6 0.7 - 4.0 K/uL   Monocytes Relative 8 %   Monocytes Absolute 0.7 0.1 - 1.0 K/uL   Eosinophils Relative 2 %   Eosinophils Absolute 0.2 0.0 - 0.5 K/uL   Basophils Relative 1 %   Basophils Absolute 0.1 0.0 - 0.1 K/uL   Immature Granulocytes 1 %   Abs Immature Granulocytes 0.07 0.00 - 0.07 K/uL    Comment: Performed at [facility], [address] [place], [place] [address]  Comprehensive metabolic panel     Status: Abnormal   Collection Time: 07/27/19  5:35 AM  Result Value Ref Range   Sodium 137 135 - 145 mmol/L   Potassium 4.6 3.5 - 5.1 mmol/L   Chloride 105 98 - 111 mmol/L   CO2 25 22 - 32 mmol/L   Glucose, Bld 105 (H) 70 - 99 mg/dL   BUN 15 6 - 20 mg/dL   Creatinine, Ser 0.72 0.44 - 1.00 mg/dL   Calcium 9.6 8.9 - 10.3 mg/dL   Total Protein 7.1 6.5 - 8.1 g/dL   Albumin 3.6 3.5 - 5.0 g/dL   AST 18 15 - 41 U/L   ALT 18 0 - 44 U/L   Alkaline Phosphatase 60 38 - 126  U/L   Total Bilirubin 0.4 0.3 - 1.2 mg/dL   GFR calc non Af Amer >60 >60 mL/min   GFR calc Af Amer >60 >60 mL/min   Anion gap 7 5 - 15    Comment: Performed at [facility], [address] [place], [place] [address]  Lipase, blood     Status: None   Collection Time: 07/27/19  5:35 AM  Result Value Ref Range   Lipase 35 11 - 51 U/L    Comment: Performed at [facility], [address] [place], [place] [address]  Urinalysis, Routine w reflex microscopic     Status: Abnormal   Collection Time: 07/27/19  5:46 AM  Result Value Ref Range   Color, Urine STRAW (A) YELLOW   APPearance CLEAR CLEAR   Specific Gravity, Urine 1.025 1.005 - 1.030   pH 5.5 5.0 - 8.0   Glucose, UA NEGATIVE NEGATIVE mg/dL   Hgb urine dipstick NEGATIVE NEGATIVE   Bilirubin Urine NEGATIVE NEGATIVE   Ketones, ur NEGATIVE NEGATIVE mg/dL   Protein, ur NEGATIVE NEGATIVE mg/dL   Nitrite NEGATIVE NEGATIVE   Leukocytes,Ua NEGATIVE NEGATIVE    Comment: Microscopic not done on urines with negative protein, blood, leukocytes, nitrite, or glucose < 500 mg/dL. Performed at [facility], [address] [place], [place] [address]   Pregnancy, urine     Status: None   Collection Time: 07/27/19  5:46 AM  Result Value Ref Range   Preg Test, Ur NEGATIVE NEGATIVE    Comment:        THE SENSITIVITY OF THIS METHODOLOGY IS >20 mIU/mL. Performed at [facility], [address] [place], [place] [address]   Lactic acid, plasma     Status: None   Collection Time: 07/27/19  8:16 AM  Result Value Ref Range   Lactic Acid, Venous 0.8 0.5 - 1.9 mmol/L    Comment: Performed at [facility], [address] [place], [place] [address]  SARS CORONAVIRUS 2 (TAT 6-24 HRS) Nasopharyngeal Nasopharyngeal Swab     Status: None   Collection Time: 07/27/19  9:02 AM   Specimen: Nasopharyngeal Swab  Result Value Ref Range  SARS Coronavirus 2 NEGATIVE NEGATIVE    Comment: (NOTE) SARS-CoV-2  target nucleic acids are NOT DETECTED. The SARS-CoV-2 RNA is generally detectable in upper and lower respiratory specimens during the acute phase of infection. Negative results do not preclude SARS-CoV-2 infection, do not rule out co-infections with other pathogens, and should not be used as the sole basis for treatment or other patient management decisions. Negative results must be combined with clinical observations, patient history, and epidemiological information. The expected result is Negative. Fact Sheet for Patients: SugarRoll.be Fact Sheet for Healthcare Providers: https://www.woods-mathews.com/ This test is not yet approved or cleared by the [place] FDA and  has been authorized for detection and/or diagnosis of SARS-CoV-2 by FDA under an Emergency Use Authorization (EUA). This EUA will remain  in effect (meaning this test can be used) for the duration of the COVID-19 declaration under Section 56 4(b)(1) of the Act, 21 U.S.C. section 360bbb-3(b)(1), unless the authorization is terminated or revoked sooner. Performed at [facility], [place] [address], [place], [place] [address]    CT ABDOMEN PELVIS W CONTRAST  Result Date: 07/27/2019 CLINICAL DATA:  Generalized abdominal pain. Diarrhea. Right upper quadrant cramping. EXAM: CT ABDOMEN AND PELVIS WITH CONTRAST TECHNIQUE: Multidetector CT imaging of the abdomen and pelvis was performed using the standard protocol following bolus administration of intravenous contrast. CONTRAST:  110m OMNIPAQUE IOHEXOL 300 MG/ML  SOLN COMPARISON:  CT abdomen and pelvis without and with contrast 05/18/2010 FINDINGS: Lower chest: Lung bases are clear without focal nodule, mass, or airspace disease. The heart size is normal. No significant pleural or pericardial effusion is present. Hepatobiliary: A peripherally calcified gallstone is present at the neck of the gallbladder. Mild inflammatory changes are  present about the gallbladder. The common bile duct and gallbladder are normal. Liver is unremarkable. Pancreas: Unremarkable. No pancreatic ductal dilatation or surrounding inflammatory changes. Spleen: Normal in size without focal abnormality. Adrenals/Urinary Tract: Adrenal glands are unremarkable. Kidneys are normal, without renal calculi, focal lesion, or hydronephrosis. Bladder is unremarkable. Stomach/Bowel: Stomach and duodenum are within normal limits. Small bowel is normal. The appendix is visualized and normal. Ascending and transverse colon are within normal limits. The descending and sigmoid colon are normal. Vascular/Lymphatic: No significant vascular findings are present. No enlarged abdominal or pelvic lymph nodes. Reproductive: Clips are present in the fallopian tubes bilaterally. IUD is in place. There is heterogeneous enlargement of the cervix measuring 3.8 x 6.1 x 4.7 cm. Uterus is otherwise unremarkable. Adnexa are within normal limits. Other: No abdominal wall hernia or abnormality. No abdominopelvic ascites. Musculoskeletal: Slight degenerative retrolisthesis is present at C2-3 and C3-4. Superior endplate Schmorl's nodes are present at L1, L2, and L3. Vertebral body heights are maintained. Alignment is otherwise anatomic. The bony pelvis is within normal limits. Hips are located and within normal limits bilaterally. IMPRESSION: 1. Peripherally calcified gallstone in the neck of the gallbladder measures 1.9 cm. 2. Mild inflammatory changes about the gallbladder raise concern for acute cholecystitis. Right upper quadrant ultrasound may be useful for further evaluation. 3. Enlarged heterogeneous cervix is concerning for malignancy. Recommend GYN referral. Electronically Signed   By: CSan MorelleM.D.   On: 07/27/2019 07:27    Review of Systems  Gastrointestinal: Positive for abdominal pain and diarrhea.  All other systems reviewed and are negative. o/w 12 point ROS negative  Blood  pressure (!) 128/51, pulse 80, temperature 98.6 F (37 C), temperature source Oral, resp. rate 16, height 5' 3"  (1.6 m), weight 115 kg,  SpO2 100 %. Physical Exam  Vitals reviewed. Constitutional: She is oriented to person, place, and time. She appears well-developed and well-nourished. No distress.  HENT:  Head: Normocephalic and atraumatic.  Right Ear: Hearing and external ear normal.  Left Ear: Hearing and external ear normal.  Nose: No nose lacerations or nasal deformity.  Eyes: Conjunctivae and lids are normal. No scleral icterus. Pupils are equal.  Neck: No tracheal deviation present. No thyromegaly present.  Cardiovascular: Normal rate and normal heart sounds.  Respiratory: Effort normal and breath sounds normal. No stridor. No respiratory distress. She has no wheezes.  GI: Soft. She exhibits no distension. There is abdominal tenderness in the right upper quadrant. There is no rigidity, no rebound and no guarding.  Musculoskeletal:        General: No tenderness or edema.     Cervical back: Normal range of motion and neck supple.  Lymphadenopathy:       Head (right side): No submandibular adenopathy present.       Head (left side): No submandibular adenopathy present.    She has no cervical adenopathy.  Neurological: She is alert and oriented to person, place, and time. No cranial nerve deficit or sensory deficit. She exhibits normal muscle tone. GCS eye subscore is 4. GCS verbal subscore is 5. GCS motor subscore is 6.  Skin: Skin is warm and dry. No rash noted. She is not diaphoretic. No erythema. No pallor.  Psychiatric: She has a normal mood and affect. Her behavior is normal. Judgment and thought content normal.     Assessment/Plan Early acute calculus cholecystitis Class III obesity Prediabetes Abnormal radiological appearing cervix Left shoulder pain  Admit observation Plan would be laparoscopic cholecystectomy with possible cholangiogram with Dr. Johney Maine in the  morning IV antibiotic Preoperative E ras medications Outpatient follow-up with her gynecologist regarding her abnormal radiological imaging of her cervix Chemical vte prophylaxis  I believe the patient's symptoms are consistent with gallbladder disease.  I discussed laparoscopic cholecystectomy with IOC in detail.    We discussed the risks and benefits of a laparoscopic cholecystectomy including, but not limited to bleeding, infection, injury to surrounding structures such as the intestine or liver, bile leak, retained gallstones, need to convert to an open procedure, prolonged diarrhea, blood clots such as  DVT, common bile duct injury, anesthesia risks, and possible need for additional procedures.  We discussed the typical post-operative recovery course. I explained that the likelihood of improvement of their symptoms is good.  Leighton Ruff. Redmond Pulling, MD, FACS General, Bariatric, & Minimally Invasive Surgery [facility], [place]   Greer Pickerel, MD 07/27/2019, 8:11 PM

## 2019-07-27 NOTE — ED Triage Notes (Signed)
Patient complains of burning and cramping pain in right upper quad with nausea and loose stool onset this am 0200.

## 2019-07-27 NOTE — ED Provider Notes (Signed)
F/U CT, diarrhea and RUQ pain. Physical Exam  BP (!) 133/56 (BP Location: Right Arm)   Pulse 81   Temp 98.1 F (36.7 C) (Oral)   Resp 18   Ht 5' 3"  (1.6 m)   Wt 115 kg   SpO2 100%   BMI 44.91 kg/m   Physical Exam Constitutional:      Comments: Patient is alert and appropriate.  No confusion.  No respiratory distress.  Nontoxic.  Pulmonary:     Effort: Pulmonary effort is normal.  Abdominal:     Comments: Abdomen is soft but severe reproducible pain in the right upper quadrant and epigastrium.  Lower abdomen is nontender.  Skin:    General: Skin is warm and dry.  Neurological:     General: No focal deficit present.     Mental Status: She is oriented to person, place, and time.  Psychiatric:        Mood and Affect: Mood normal.     ED Course/Procedures   Clinical Course as of Jul 27 919  Fri Jul 27, 2019  0810 PA-C for general surgery has returned call and will relay patient HPI.  Will await return phone call.   [MP]    Clinical Course User Index [MP] Charlesetta Shanks, MD    Procedures  MDM  Consult: Dr. Redmond Pulling has returned phone call.  Will except for admission to [facility].  Advises patient may have clear liquid diet.  Anticipates surgical intervention tomorrow morning.  Patient is alert and nontoxic.  She continues to have very reproducible right upper quadrant pain.  We will continue as needed morphine and Zofran.  I have added Zosyn every 8 hours.  Patient will be transferred to [facility] for [place] admission under Dr. Redmond Pulling.       Charlesetta Shanks, MD 07/27/19 [phone]

## 2019-07-27 NOTE — ED Notes (Signed)
Pt c/o nausea. Did not drink much broth. EDP made aware

## 2019-07-27 NOTE — ED Provider Notes (Signed)
[place] DE[facility] Provider Note: Georgena Spurling, MD, FACEP  CSN: [id number] MRN: [id number] ARRIVAL: 07/27/19 at [place]: 1308/1308-01   CHIEF COMPLAINT  Abdominal Pain   HISTORY OF PRESENT ILLNESS  07/27/19 5:10 AM Linda Shepherd is a 43 y.o. female who has had right upper quadrant, crampy abdominal pain since about 2 AM this morning.  There is also a burning generalized abdominal pain as well.  The pain is constant and she rates it as an 8 out of 10.  Is worse with movement or palpation.  She has had nausea but no vomiting.  She has had watery diarrhea this morning, although she has had loose stools for several weeks.  She did not notice blood in her diarrhea this morning   Past Medical History:  Diagnosis Date  . Anemia   . Prediabetes     Past Surgical History:  Procedure Laterality Date  . BREAST BIOPSY  2000   benign  . TUBAL LIGATION  2004   postpartum    Family History  Problem Relation Age of Onset  . Diabetes Maternal Grandmother   . Congestive Heart Failure Maternal Grandmother   . Depression Mother   . Stroke Mother 59  . Breast cancer Cousin 6       paternal first cousin  . Colon cancer Neg Hx     Social History   Tobacco Use  . Smoking status: Never Smoker  . Smokeless tobacco: Never Used  Substance Use Topics  . Alcohol use: No    Comment: occ wine  . Drug use: No    Prior to Admission medications   Medication Sig Start Date End Date Taking? Authorizing Provider  levonorgestrel (MIRENA) 20 MCG/24HR IUD 1 Intra Uterine Device (1 each total) by Intrauterine route once. 03/06/15   Salvadore Dom, MD    Allergies Patient has no known allergies.   REVIEW OF SYSTEMS  Negative except as noted here or in the History of Present Illness.   PHYSICAL EXAMINATION  Initial Vital Signs Blood pressure (!) 133/56, pulse 81, temperature 98.1 F (36.7 C), temperature source Oral, resp. rate 18, height 5\' 3"  (1.6 m), weight 115 kg, SpO2  100 %.  Examination General: Well-developed, well-nourished female in no acute distress; appearance consistent with age of record HENT: normocephalic; atraumatic Eyes: pupils equal, round and reactive to light; extraocular muscles intact Neck: supple Heart: regular rate and rhythm Lungs: clear to auscultation bilaterally Abdomen: soft; obese; right upper quadrant tenderness; bowel sounds present Extremities: No deformity; full range of motion; pulses normal Neurologic: Awake, alert and oriented; motor function intact in all extremities and symmetric; no facial droop Skin: Warm and dry Psychiatric: Normal mood and affect   RESULTS  Summary of this visit's results, reviewed and interpreted by myself:   EKG Interpretation  Date/Time:    Ventricular Rate:    PR Interval:    QRS Duration:   QT Interval:    QTC Calculation:   R Axis:     Text Interpretation:        Laboratory Studies: Results for orders placed or performed during the hospital encounter of 07/27/19 (from the past 24 hour(s))  CBC with Differential     Status: None   Collection Time: 07/27/19  5:35 AM  Result Value Ref Range   WBC 8.2 4.0 - 10.5 K/uL   RBC 4.20 3.87 - 5.11 MIL/uL   Hemoglobin 12.1 12.0 - 15.0 g/dL   HCT 37.7 36.0 - 46.0 %  MCV 89.8 80.0 - 100.0 fL   MCH 28.8 26.0 - 34.0 pg   MCHC 32.1 30.0 - 36.0 g/dL   RDW 13.6 11.5 - 15.5 %   Platelets 227 150 - 400 K/uL   nRBC 0.0 0.0 - 0.2 %   Neutrophils Relative % 56 %   Neutro Abs 4.6 1.7 - 7.7 K/uL   Lymphocytes Relative 32 %   Lymphs Abs 2.6 0.7 - 4.0 K/uL   Monocytes Relative 8 %   Monocytes Absolute 0.7 0.1 - 1.0 K/uL   Eosinophils Relative 2 %   Eosinophils Absolute 0.2 0.0 - 0.5 K/uL   Basophils Relative 1 %   Basophils Absolute 0.1 0.0 - 0.1 K/uL   Immature Granulocytes 1 %   Abs Immature Granulocytes 0.07 0.00 - 0.07 K/uL  Comprehensive metabolic panel     Status: Abnormal   Collection Time: 07/27/19  5:35 AM  Result Value Ref  Range   Sodium 137 135 - 145 mmol/L   Potassium 4.6 3.5 - 5.1 mmol/L   Chloride 105 98 - 111 mmol/L   CO2 25 22 - 32 mmol/L   Glucose, Bld 105 (H) 70 - 99 mg/dL   BUN 15 6 - 20 mg/dL   Creatinine, Ser 0.72 0.44 - 1.00 mg/dL   Calcium 9.6 8.9 - 10.3 mg/dL   Total Protein 7.1 6.5 - 8.1 g/dL   Albumin 3.6 3.5 - 5.0 g/dL   AST 18 15 - 41 U/L   ALT 18 0 - 44 U/L   Alkaline Phosphatase 60 38 - 126 U/L   Total Bilirubin 0.4 0.3 - 1.2 mg/dL   GFR calc non Af Amer >60 >60 mL/min   GFR calc Af Amer >60 >60 mL/min   Anion gap 7 5 - 15  Lipase, blood     Status: None   Collection Time: 07/27/19  5:35 AM  Result Value Ref Range   Lipase 35 11 - 51 U/L  Urinalysis, Routine w reflex microscopic     Status: Abnormal   Collection Time: 07/27/19  5:46 AM  Result Value Ref Range   Color, Urine STRAW (A) YELLOW   APPearance CLEAR CLEAR   Specific Gravity, Urine 1.025 1.005 - 1.030   pH 5.5 5.0 - 8.0   Glucose, UA NEGATIVE NEGATIVE mg/dL   Hgb urine dipstick NEGATIVE NEGATIVE   Bilirubin Urine NEGATIVE NEGATIVE   Ketones, ur NEGATIVE NEGATIVE mg/dL   Protein, ur NEGATIVE NEGATIVE mg/dL   Nitrite NEGATIVE NEGATIVE   Leukocytes,Ua NEGATIVE NEGATIVE  Pregnancy, urine     Status: None   Collection Time: 07/27/19  5:46 AM  Result Value Ref Range   Preg Test, Ur NEGATIVE NEGATIVE  Lactic acid, plasma     Status: None   Collection Time: 07/27/19  8:16 AM  Result Value Ref Range   Lactic Acid, Venous 0.8 0.5 - 1.9 mmol/L  SARS CORONAVIRUS 2 (TAT 6-24 HRS) Nasopharyngeal Nasopharyngeal Swab     Status: None   Collection Time: 07/27/19  9:02 AM   Specimen: Nasopharyngeal Swab  Result Value Ref Range   SARS Coronavirus 2 NEGATIVE NEGATIVE   Imaging Studies: CT ABDOMEN PELVIS W CONTRAST  Result Date: 07/27/2019 CLINICAL DATA:  Generalized abdominal pain. Diarrhea. Right upper quadrant cramping. EXAM: CT ABDOMEN AND PELVIS WITH CONTRAST TECHNIQUE: Multidetector CT imaging of the abdomen and  pelvis was performed using the standard protocol following bolus administration of intravenous contrast. CONTRAST:  110mL OMNIPAQUE IOHEXOL 300 MG/ML  SOLN COMPARISON:  CT abdomen and pelvis without and with contrast 05/18/2010 FINDINGS: Lower chest: Lung bases are clear without focal nodule, mass, or airspace disease. The heart size is normal. No significant pleural or pericardial effusion is present. Hepatobiliary: A peripherally calcified gallstone is present at the neck of the gallbladder. Mild inflammatory changes are present about the gallbladder. The common bile duct and gallbladder are normal. Liver is unremarkable. Pancreas: Unremarkable. No pancreatic ductal dilatation or surrounding inflammatory changes. Spleen: Normal in size without focal abnormality. Adrenals/Urinary Tract: Adrenal glands are unremarkable. Kidneys are normal, without renal calculi, focal lesion, or hydronephrosis. Bladder is unremarkable. Stomach/Bowel: Stomach and duodenum are within normal limits. Small bowel is normal. The appendix is visualized and normal. Ascending and transverse colon are within normal limits. The descending and sigmoid colon are normal. Vascular/Lymphatic: No significant vascular findings are present. No enlarged abdominal or pelvic lymph nodes. Reproductive: Clips are present in the fallopian tubes bilaterally. IUD is in place. There is heterogeneous enlargement of the cervix measuring 3.8 x 6.1 x 4.7 cm. Uterus is otherwise unremarkable. Adnexa are within normal limits. Other: No abdominal wall hernia or abnormality. No abdominopelvic ascites. Musculoskeletal: Slight degenerative retrolisthesis is present at C2-3 and C3-4. Superior endplate Schmorl's nodes are present at L1, L2, and L3. Vertebral body heights are maintained. Alignment is otherwise anatomic. The bony pelvis is within normal limits. Hips are located and within normal limits bilaterally. IMPRESSION: 1. Peripherally calcified gallstone in the neck  of the gallbladder measures 1.9 cm. 2. Mild inflammatory changes about the gallbladder raise concern for acute cholecystitis. Right upper quadrant ultrasound may be useful for further evaluation. 3. Enlarged heterogeneous cervix is concerning for malignancy. Recommend GYN referral. Electronically Signed   By: San Morelle M.D.   On: 07/27/2019 07:27    ED COURSE and MDM  Nursing notes, initial and subsequent vitals signs, including pulse oximetry, reviewed and interpreted by myself.  Vitals:   07/27/19 1336 07/27/19 1712 07/27/19 1849 07/27/19 2115  BP: 107/85 (!) 120/53 (!) 128/51 137/71  Pulse: 81 76 80 85  Resp: 16 16 16 16   Temp:   98.6 F (37 C) 98.4 F (36.9 C)  TempSrc:   Oral Oral  SpO2: 98% 99% 100% 98%  Weight:      Height:       Medications  ondansetron (ZOFRAN) 4 MG/2ML injection (4 mg  Given 07/27/19 0523)  fentaNYL (SUBLIMAZE) injection 100 mcg (100 mcg Intravenous Given 07/27/19 0528)   7:00 AM CT results pending. Dr. Johnney Killian to follow up and make disposition.  PROCEDURES  Procedures   ED DIAGNOSES     ICD-10-CM   1. Cholecystitis, acute  K81.0        Levada Bowersox, MD 07/27/19 2245

## 2019-07-27 NOTE — Telephone Encounter (Signed)
Spoke with pt. Pt states had CT scan for gallstones yesterday and they noticed thickening of cervix and suggested having Pap smear. Pt has IUD in place, has no cycles. Denies any current problems.   Pt had last AEX on 05/24/19 and didn't have pap due to guidelines. Last pap was in 03/2016. Pt states wanting to have OV with Dr Talbert Nan to discuss and possibly have pap smear sooner than next AEX on 05/28/2020. Pt scheduled for OV with Dr Talbert Nan on 08/07/19 at 1pm. Pt agreeable. Pt also having gallstones/gallbladder surgery on 07/28/19. Pt verbalized understanding.   Will route to Dr Talbert Nan for review and any additional recommendations.

## 2019-07-28 ENCOUNTER — Encounter (HOSPITAL_COMMUNITY): Admission: EM | Disposition: A | Discharge status: Home / Self Care | Payer: Self-pay | Source: Home / Self Care

## 2019-07-28 ENCOUNTER — Inpatient Hospital Stay (HOSPITAL_COMMUNITY): Payer: 59 | Admitting: Anesthesiology

## 2019-07-28 ENCOUNTER — Inpatient Hospital Stay (HOSPITAL_COMMUNITY): Payer: 59

## 2019-07-28 HISTORY — PX: CHOLECYSTECTOMY: SHX55

## 2019-07-28 LAB — SURGICAL PCR SCREEN
MRSA, PCR: NEGATIVE
Staphylococcus aureus: NEGATIVE

## 2019-07-28 SURGERY — LAPAROSCOPIC CHOLECYSTECTOMY WITH INTRAOPERATIVE CHOLANGIOGRAM
Anesthesia: General | Site: Abdomen

## 2019-07-28 MED ORDER — ROCURONIUM BROMIDE 50 MG/5ML IV SOSY
PREFILLED_SYRINGE | INTRAVENOUS | Status: DC | PRN
  Administered 2019-07-28: 50 mg via INTRAVENOUS

## 2019-07-28 MED ORDER — FENTANYL CITRATE (PF) 100 MCG/2ML IJ SOLN
INTRAMUSCULAR | Status: DC | PRN
  Administered 2019-07-28: 100 ug via INTRAVENOUS

## 2019-07-28 MED ORDER — PROPOFOL 10 MG/ML IV BOLUS
INTRAVENOUS | Status: DC | PRN
  Administered 2019-07-28: 200 mg via INTRAVENOUS

## 2019-07-28 MED ORDER — METRONIDAZOLE IN NACL 5-0.79 MG/ML-% IV SOLN
INTRAVENOUS | Status: AC
  Filled 2019-07-28: qty 100

## 2019-07-28 MED ORDER — METHOCARBAMOL 500 MG IVPB - SIMPLE MED
INTRAVENOUS | Status: AC
  Administered 2019-07-28: 500 mg
  Filled 2019-07-28: qty 50

## 2019-07-28 MED ORDER — SODIUM CHLORIDE 0.9 % IV SOLN: 250.0000 mL | INTRAVENOUS | Status: DC | PRN

## 2019-07-28 MED ORDER — PHENYLEPHRINE 40 MCG/ML (10ML) SYRINGE FOR IV PUSH (FOR BLOOD PRESSURE SUPPORT)
PREFILLED_SYRINGE | INTRAVENOUS | Status: DC | PRN
  Administered 2019-07-28: 120 ug via INTRAVENOUS
  Administered 2019-07-28: 80 ug via INTRAVENOUS

## 2019-07-28 MED ORDER — BUPIVACAINE HCL 0.5 % IJ SOLN
INTRAMUSCULAR | Status: DC | PRN
  Administered 2019-07-28: 60 mL

## 2019-07-28 MED ORDER — METHOCARBAMOL 500 MG PO TABS: 1000.0000 mg | ORAL_TABLET | Freq: Four times a day (QID) | ORAL | Status: DC | PRN

## 2019-07-28 MED ORDER — FENTANYL CITRATE (PF) 100 MCG/2ML IJ SOLN
INTRAMUSCULAR | Status: AC
  Administered 2019-07-28: 50 ug via INTRAVENOUS
  Filled 2019-07-28: qty 2

## 2019-07-28 MED ORDER — SUGAMMADEX SODIUM 500 MG/5ML IV SOLN
INTRAVENOUS | Status: DC | PRN
  Administered 2019-07-28: 300 mg via INTRAVENOUS

## 2019-07-28 MED ORDER — IOHEXOL 300 MG/ML  SOLN
INTRAMUSCULAR | Status: DC | PRN
  Administered 2019-07-28: 12:00:00 5 mL

## 2019-07-28 MED ORDER — SUGAMMADEX SODIUM 200 MG/2ML IV SOLN: INTRAVENOUS | Status: DC | PRN

## 2019-07-28 MED ORDER — CEFAZOLIN SODIUM-DEXTROSE 2-4 GM/100ML-% IV SOLN
INTRAVENOUS | Status: AC
  Filled 2019-07-28: qty 100

## 2019-07-28 MED ORDER — LIDOCAINE 2% (20 MG/ML) 5 ML SYRINGE
INTRAMUSCULAR | Status: AC
  Filled 2019-07-28: qty 5

## 2019-07-28 MED ORDER — SODIUM CHLORIDE 0.9% FLUSH: 3.0000 mL | INTRAVENOUS | Status: DC | PRN

## 2019-07-28 MED ORDER — OXYCODONE HCL 5 MG PO TABS: 5.0000 mg | ORAL_TABLET | Freq: Once | ORAL | Status: DC | PRN

## 2019-07-28 MED ORDER — PROPOFOL 10 MG/ML IV BOLUS
INTRAVENOUS | Status: AC
  Filled 2019-07-28: qty 20

## 2019-07-28 MED ORDER — ONDANSETRON HCL 4 MG/2ML IJ SOLN: 4.0000 mg | Freq: Four times a day (QID) | INTRAMUSCULAR | Status: DC | PRN

## 2019-07-28 MED ORDER — SUGAMMADEX SODIUM 500 MG/5ML IV SOLN
INTRAVENOUS | Status: AC
  Filled 2019-07-28: qty 5

## 2019-07-28 MED ORDER — ONDANSETRON HCL 4 MG/2ML IJ SOLN
INTRAMUSCULAR | Status: AC
  Filled 2019-07-28: qty 2

## 2019-07-28 MED ORDER — LACTATED RINGERS IV BOLUS: 1000.0000 mL | Freq: Three times a day (TID) | INTRAVENOUS | Status: DC | PRN

## 2019-07-28 MED ORDER — CEFAZOLIN SODIUM-DEXTROSE 2-3 GM-%(50ML) IV SOLR
INTRAVENOUS | Status: DC | PRN
  Administered 2019-07-28: 2 g via INTRAVENOUS

## 2019-07-28 MED ORDER — ONDANSETRON HCL 4 MG PO TABS: 4.0000 mg | ORAL_TABLET | Freq: Three times a day (TID) | ORAL | 2 refills | Status: DC | PRN

## 2019-07-28 MED ORDER — TRAMADOL HCL 50 MG PO TABS
50.0000 mg | ORAL_TABLET | Freq: Four times a day (QID) | ORAL | Status: DC | PRN
  Administered 2019-07-28: 50 mg via ORAL
  Filled 2019-07-28: qty 1

## 2019-07-28 MED ORDER — TRAMADOL HCL 50 MG PO TABS: 50.0000 mg | ORAL_TABLET | Freq: Four times a day (QID) | ORAL | 0 refills | Status: DC | PRN

## 2019-07-28 MED ORDER — ONDANSETRON HCL 4 MG/2ML IJ SOLN
INTRAMUSCULAR | Status: DC | PRN
  Administered 2019-07-28: 4 mg via INTRAVENOUS

## 2019-07-28 MED ORDER — PHENYLEPHRINE 40 MCG/ML (10ML) SYRINGE FOR IV PUSH (FOR BLOOD PRESSURE SUPPORT)
PREFILLED_SYRINGE | INTRAVENOUS | Status: AC
  Filled 2019-07-28: qty 10

## 2019-07-28 MED ORDER — METHOCARBAMOL 750 MG PO TABS: 750.0000 mg | ORAL_TABLET | Freq: Four times a day (QID) | ORAL | 1 refills | Status: DC | PRN

## 2019-07-28 MED ORDER — METRONIDAZOLE IN NACL 5-0.79 MG/ML-% IV SOLN
INTRAVENOUS | Status: DC | PRN
  Administered 2019-07-28: 500 mg via INTRAVENOUS

## 2019-07-28 MED ORDER — OXYCODONE HCL 5 MG/5ML PO SOLN: 5.0000 mg | Freq: Once | ORAL | Status: DC | PRN

## 2019-07-28 MED ORDER — FENTANYL CITRATE (PF) 250 MCG/5ML IJ SOLN
INTRAMUSCULAR | Status: AC
  Filled 2019-07-28: qty 5

## 2019-07-28 MED ORDER — FENTANYL CITRATE (PF) 100 MCG/2ML IJ SOLN
INTRAMUSCULAR | Status: AC
  Administered 2019-07-28: 13:00:00 50 ug via INTRAVENOUS
  Filled 2019-07-28: qty 2

## 2019-07-28 MED ORDER — DEXAMETHASONE SODIUM PHOSPHATE 10 MG/ML IJ SOLN
INTRAMUSCULAR | Status: AC
  Filled 2019-07-28: qty 1

## 2019-07-28 MED ORDER — 0.9 % SODIUM CHLORIDE (POUR BTL) OPTIME
TOPICAL | Status: DC | PRN
  Administered 2019-07-28: 12:00:00 1000 mL

## 2019-07-28 MED ORDER — FENTANYL CITRATE (PF) 100 MCG/2ML IJ SOLN
25.0000 ug | INTRAMUSCULAR | Status: DC | PRN
  Administered 2019-07-28: 50 ug via INTRAVENOUS

## 2019-07-28 MED ORDER — ROCURONIUM BROMIDE 10 MG/ML (PF) SYRINGE
PREFILLED_SYRINGE | INTRAVENOUS | Status: AC
  Filled 2019-07-28: qty 10

## 2019-07-28 MED ORDER — LIDOCAINE 2% (20 MG/ML) 5 ML SYRINGE
INTRAMUSCULAR | Status: DC | PRN
  Administered 2019-07-28: 60 mg via INTRAVENOUS

## 2019-07-28 MED ORDER — BUPIVACAINE HCL (PF) 0.5 % IJ SOLN
INTRAMUSCULAR | Status: AC
  Filled 2019-07-28: qty 60

## 2019-07-28 MED ORDER — SODIUM CHLORIDE 0.9% FLUSH: 3.0000 mL | Freq: Two times a day (BID) | INTRAVENOUS | Status: DC

## 2019-07-28 MED ORDER — MIDAZOLAM HCL 5 MG/5ML IJ SOLN
INTRAMUSCULAR | Status: DC | PRN
  Administered 2019-07-28: 2 mg via INTRAVENOUS

## 2019-07-28 MED ORDER — DEXAMETHASONE SODIUM PHOSPHATE 4 MG/ML IJ SOLN
INTRAMUSCULAR | Status: DC | PRN
  Administered 2019-07-28: 10 mg via INTRAVENOUS

## 2019-07-28 MED ORDER — MIDAZOLAM HCL 2 MG/2ML IJ SOLN
INTRAMUSCULAR | Status: AC
  Filled 2019-07-28: qty 2

## 2019-07-28 SURGICAL SUPPLY — 40 items
APPLIER CLIP 5 13 M/L LIGAMAX5 (MISCELLANEOUS)
APPLIER CLIP ROT 10 11.4 M/L (STAPLE)
CABLE HIGH FREQUENCY MONO STRZ (ELECTRODE) IMPLANT
CLIP APPLIE 5 13 M/L LIGAMAX5 (MISCELLANEOUS) IMPLANT
CLIP APPLIE ROT 10 11.4 M/L (STAPLE) IMPLANT
COVER MAYO STAND STRL (DRAPES) ×2 IMPLANT
COVER SURGICAL LIGHT HANDLE (MISCELLANEOUS) ×2 IMPLANT
COVER WAND RF STERILE (DRAPES) ×2 IMPLANT
DECANTER SPIKE VIAL GLASS SM (MISCELLANEOUS) ×2 IMPLANT
DRAPE C-ARM 42X120 X-RAY (DRAPES) ×2 IMPLANT
DRAPE UTILITY XL STRL (DRAPES) ×2 IMPLANT
DRAPE WARM FLUID 44X44 (DRAPES) ×2 IMPLANT
DRSG TEGADERM 2-3/8X2-3/4 SM (GAUZE/BANDAGES/DRESSINGS) ×2 IMPLANT
DRSG TEGADERM 4X4.75 (GAUZE/BANDAGES/DRESSINGS) ×2 IMPLANT
ELECT REM PT RETURN 15FT ADLT (MISCELLANEOUS) ×2 IMPLANT
ENDOLOOP SUT PDS II  0 18 (SUTURE)
ENDOLOOP SUT PDS II 0 18 (SUTURE) IMPLANT
GAUZE SPONGE 2X2 8PLY STRL LF (GAUZE/BANDAGES/DRESSINGS) ×1 IMPLANT
GLOVE ECLIPSE 8.0 STRL XLNG CF (GLOVE) ×2 IMPLANT
GLOVE INDICATOR 8.0 STRL GRN (GLOVE) ×2 IMPLANT
GOWN STRL REUS W/TWL XL LVL3 (GOWN DISPOSABLE) ×4 IMPLANT
IRRIG SUCT STRYKERFLOW 2 WTIP (MISCELLANEOUS) ×2
IRRIGATION SUCT STRKRFLW 2 WTP (MISCELLANEOUS) ×1 IMPLANT
KIT BASIN OR (CUSTOM PROCEDURE TRAY) ×2 IMPLANT
KIT TURNOVER KIT A (KITS) IMPLANT
PENCIL SMOKE EVACUATOR (MISCELLANEOUS) IMPLANT
POUCH RETRIEVAL ECOSAC 10 (ENDOMECHANICALS) ×1 IMPLANT
POUCH RETRIEVAL ECOSAC 10MM (ENDOMECHANICALS) ×1
SCISSORS LAP 5X35 DISP (ENDOMECHANICALS) ×2 IMPLANT
SET CHOLANGIOGRAPH MIX (MISCELLANEOUS) ×2 IMPLANT
SET TUBE SMOKE EVAC HIGH FLOW (TUBING) ×2 IMPLANT
SLEEVE XCEL OPT CAN 5 100 (ENDOMECHANICALS) IMPLANT
SPONGE GAUZE 2X2 STER 10/PKG (GAUZE/BANDAGES/DRESSINGS) ×1
SUT MNCRL AB 4-0 PS2 18 (SUTURE) ×2 IMPLANT
SYR 20ML LL LF (SYRINGE) ×2 IMPLANT
TOWEL OR 17X26 10 PK STRL BLUE (TOWEL DISPOSABLE) ×2 IMPLANT
TOWEL OR NON WOVEN STRL DISP B (DISPOSABLE) ×2 IMPLANT
TRAY LAPAROSCOPIC (CUSTOM PROCEDURE TRAY) ×2 IMPLANT
TROCAR BLADELESS OPT 5 100 (ENDOMECHANICALS) ×2 IMPLANT
TROCAR XCEL NON-BLD 11X100MML (ENDOMECHANICALS) ×2 IMPLANT

## 2019-07-28 NOTE — Anesthesia Postprocedure Evaluation (Signed)
Anesthesia Post Note  Patient: Linda Shepherd  Procedure(s) Performed: LAPAROSCOPIC CHOLECYSTECTOMY WITH INTRAOPERATIVE CHOLANGIOGRAM (N/A Abdomen)     Patient location during evaluation: PACU Anesthesia Type: General Level of consciousness: awake and alert Pain management: pain level controlled Vital Signs Assessment: post-procedure vital signs reviewed and stable Respiratory status: spontaneous breathing, nonlabored ventilation, respiratory function stable and patient connected to nasal cannula oxygen Cardiovascular status: blood pressure returned to baseline and stable Postop Assessment: no apparent nausea or vomiting Anesthetic complications: no    Last Vitals:  Vitals:   07/28/19 0516 07/28/19 1220  BP: (!) 108/51 132/89  Pulse: 66 91  Resp: 18 19  Temp: 36.8 C   SpO2: 98% 100%    Last Pain:  Vitals:   07/28/19 1220  TempSrc:   PainSc: (P) 0-No pain                 [name] S

## 2019-07-28 NOTE — Anesthesia Procedure Notes (Signed)
Procedure Name: Intubation Date/Time: 07/28/2019 11:09 AM Performed by: Deliah Boston, CRNA Pre-anesthesia Checklist: Patient identified, Emergency Drugs available, Suction available and Patient being monitored Patient Re-evaluated:Patient Re-evaluated prior to induction Oxygen Delivery Method: Circle system utilized Preoxygenation: Pre-oxygenation with 100% oxygen Induction Type: IV induction Ventilation: Mask ventilation without difficulty Laryngoscope Size: Mac and 3 Grade View: Grade I Tube type: Oral Tube size: 7.5 mm Number of attempts: 1 Airway Equipment and Method: Stylet and Oral airway Placement Confirmation: ETT inserted through vocal cords under direct vision,  positive ETCO2 and breath sounds checked- equal and bilateral Secured at: 20 cm Tube secured with: Tape Dental Injury: Teeth and Oropharynx as per pre-operative assessment

## 2019-07-28 NOTE — Discharge Instructions (Signed)
LAPAROSCOPIC SURGERY: POST OP INSTRUCTIONS  ######################################################################  EAT Gradually transition to a high fiber diet with a fiber supplement over the next few weeks after discharge.  Start with a pureed / full liquid diet (see below)  WALK Walk an hour a day.  Control your pain to do that.    CONTROL PAIN Control pain so that you can walk, sleep, tolerate sneezing/coughing, go up/down stairs.  HAVE A BOWEL MOVEMENT DAILY Keep your bowels regular to avoid problems.  OK to try a laxative to override constipation.  OK to use an antidairrheal to slow down diarrhea.  Call if not better after 2 tries  CALL IF YOU HAVE PROBLEMS/CONCERNS Call if you are still struggling despite following these instructions. Call if you have concerns not answered by these instructions  ######################################################################    1. DIET: Follow a light bland diet & liquids the first 24 hours after arrival home, such as soup, liquids, starches, etc.  Be sure to drink plenty of fluids.  Quickly advance to a usual solid diet within a few days.  Avoid fast food or heavy meals as your are more likely to get nauseated or have irregular bowels.  A low-fat, high-fiber diet for the rest of your life is ideal.  2. Take your usually prescribed home medications unless otherwise directed.  3. PAIN CONTROL: a. Pain is best controlled by a usual combination of three different methods TOGETHER: i. Ice/Heat ii. Over the counter pain medication iii. Prescription pain medication b. Most patients will experience some swelling and bruising around the incisions.  Ice packs or heating pads (30-60 minutes up to 6 times a day) will help. Use ice for the first few days to help decrease swelling and bruising, then switch to heat to help relax tight/sore spots and speed recovery.  Some people prefer to use ice alone, heat alone, alternating between ice & heat.   Experiment to what works for you.  Swelling and bruising can take several weeks to resolve.   c. It is helpful to take an over-the-counter pain medication regularly for the first few weeks.  Choose one of the following that works best for you: i. Naproxen (Aleve, etc)  Two 244m tabs twice a day ii. Ibuprofen (Advil, etc) Three 2027mtabs four times a day (every meal & bedtime) iii. Acetaminophen (Tylenol, etc) 500-65033mour times a day (every meal & bedtime) d. A  prescription for pain medication (such as oxycodone, hydrocodone, tramadol, gabapentin, methocarbamol, etc) should be given to you upon discharge.  Take your pain medication as prescribed.  i. If you are having problems/concerns with the prescription medicine (does not control pain, nausea, vomiting, rash, itching, etc), please call us [phone] see if we need to switch you to a different pain medicine that will work better for you and/or control your side effect better. ii. If you need a refill on your pain medication, please give us [place] hour notice.  contact your pharmacy.  They will contact our office to request authorization. Prescriptions will not be filled after 5 pm or on week-ends  4. Avoid getting constipated.   a. Between the surgery and the pain medications, it is common to experience some constipation.   b. Increasing fluid intake and taking a fiber supplement (such as Metamucil, Citrucel, FiberCon, MiraLax, etc) 1-2 times a day regularly will usually help prevent this problem from occurring.   c. A mild laxative (prune juice, Milk of Magnesia, MiraLax, etc) should be taken according to  package directions if there are no bowel movements after 48 hours.   5. Watch out for diarrhea.   a. If you have many loose bowel movements, simplify your diet to bland foods & liquids for a few days.   b. Stop any stool softeners and decrease your fiber supplement.   c. Switching to mild anti-diarrheal medications (Kayopectate, Pepto  Bismol) can help.   d. If this worsens or does not improve, please call us.  6. Wash / shower every day.  You may shower over the dressings as they are waterproof.  Continue to shower over incision(s) after the dressing is off.  7. Remove your waterproof bandages 5 days after surgery.  You may leave the incision open to air.  You may replace a dressing/Band-Aid to cover the incision for comfort if you wish.   8. ACTIVITIES as tolerated:   a. You may resume regular (light) daily activities beginning the next day--such as daily self-care, walking, climbing stairs--gradually increasing activities as tolerated.  If you can walk 30 minutes without difficulty, it is safe to try more intense activity such as jogging, treadmill, bicycling, low-impact aerobics, swimming, etc. b. Save the most intensive and strenuous activity for last such as sit-ups, heavy lifting, contact sports, etc  Refrain from any heavy lifting or straining until you are off narcotics for pain control.   c. DO NOT PUSH THROUGH PAIN.  Let pain be your guide: If it hurts to do something, don't do it.  Pain is your body warning you to avoid that activity for another week until the pain goes down. d. You may drive when you are no longer taking prescription pain medication, you can comfortably wear a seatbelt, and you can safely maneuver your car and apply brakes. e. Dennis Bast may have sexual intercourse when it is comfortable.  9. FOLLOW UP in our office a. Please call [facility] at [phone] to set up an appointment to see your surgeon in the office for a follow-up appointment approximately 2-3 weeks after your surgery. b. Make sure that you call for this appointment the day you arrive home to insure a convenient appointment time.  10. IF YOU HAVE DISABILITY OR FAMILY LEAVE FORMS, BRING THEM TO THE OFFICE FOR PROCESSING.  DO NOT GIVE THEM TO YOUR DOCTOR.   WHEN TO CALL us [phone]: 1. Poor pain control 2. Reactions / problems with new  medications (rash/itching, nausea, etc)  3. Fever over 101.5 F (38.5 C) 4. Inability to urinate 5. Nausea and/or vomiting 6. Worsening swelling or bruising 7. Continued bleeding from incision. 8. Increased pain, redness, or drainage from the incision   The clinic staff is available to answer your questions during regular business hours (8:30am-5pm).  Please don't hesitate to call and ask to speak to one of our nurses for clinical concerns.   If you have a medical emergency, go to the nearest emergency room or call 911.  A surgeon from [facility] is always on call at the [facility], [place], [place], [place], [place]  [address] ? MAIN: [phone] ? TOLL FREE: [phone] ?  FAX [phone] [email or web address]    Cholecystitis  Cholecystitis is inflammation of the gallbladder. It is often called a gallbladder attack. The gallbladder is a pear-shaped organ that lies beneath the liver on the right side of the body. The gallbladder stores bile, which is a fluid that helps the body digest fats. If bile builds  up in your gallbladder, your gallbladder becomes inflamed. This condition may occur suddenly. Cholecystitis is a serious condition and requires treatment. What are the causes? The most common cause of this condition is gallstones. Gallstones can block the tube (duct) that carries bile out of your gallbladder. This causes bile to build up. Other causes include:  Damage to the gallbladder due to a decrease in blood flow.  Infections in the bile ducts.  Scars or kinks in the bile ducts.  Tumors in the liver, pancreas, or gallbladder. What increases the risk? You are more likely to develop this condition if:  You have sickle cell disease.  You take birth control pills or use estrogen.  You have alcoholic liver disease.  You have liver cirrhosis.  You have your nutrition delivered through a vein  (parenteral nutrition).  You are critically ill.  You do not eat or drink for a long time. This is also called "fasting."  You are obese.  You lose weight too fast.  You are pregnant.  You have high levels of fat (triglycerides) in the blood.  You have pancreatitis. What are the signs or symptoms? Symptoms of this condition include:  Pain in the abdomen, especially in the upper right area of the abdomen.  Tenderness or bloating in the abdomen.  Nausea.  Vomiting.  Fever.  Chills. How is this diagnosed? This condition is diagnosed with a medical history and physical exam. You may also have other tests, including:  Imaging tests, such as: ? An ultrasound of the gallbladder. ? A CT scan of the abdomen. ? A gallbladder nuclear scan (HIDA scan). This scan allows your health care provider to see the bile moving from your liver to your gallbladder and on to your small intestine. ? MRI.  Blood tests, such as: ? A complete blood count. The white blood cell count may be higher than normal. ? Liver function tests. Certain types of gallstones cause some results to be higher than normal. How is this treated? Treatment may include:  Surgery to remove your gallbladder (cholecystectomy).  Antibiotic medicine, usually through an IV.  Fasting for a certain amount of time.  Giving IV fluids.  Medicine to treat pain or vomiting. Follow these instructions at home:  If you had surgery, follow instructions from your health care provider about home care after the procedure. Medicines   Take over-the-counter and prescription medicines only as told by your health care provider.  If you were prescribed an antibiotic medicine, take it as told by your health care provider. Do not stop taking the antibiotic even if you start to feel better. General instructions  Follow instructions from your health care provider about what to eat or drink. When you are allowed to eat, avoid eating  or drinking anything that triggers your symptoms.  Do not lift anything that is heavier than 10 lb (4.5 kg), or the limit that you are told, until your health care provider says that it is safe.  Do not use any products that contain nicotine or tobacco, such as cigarettes and e-cigarettes. If you need help quitting, ask your health care provider.  Keep all follow-up visits as told by your health care provider. This is important. Contact a health care provider if:  Your pain is not controlled with medicine.  You have a fever. Get help right away if:  Your pain moves to another part of your abdomen or to your back.  You continue to have symptoms or you develop new  symptoms even with treatment. Summary  Cholecystitis is inflammation of the gallbladder.  The most common cause of this condition is gallstones. Gallstones can block the tube (duct) that carries bile out of your gallbladder.  Common symptoms are pain in the abdomen, nausea, vomiting, fever, and chills.  This condition is treated with surgery to remove the gallbladder, medicines, fasting, and IV fluids.  Follow your health care provider's instructions for eating and drinking. Avoid eating anything that triggers your symptoms. This information is not intended to replace advice given to you by your health care provider. Make sure you discuss any questions you have with your health care provider. Document Released: 07/26/2005 Document Revised: 12/02/2017 Document Reviewed: 12/02/2017 Elsevier Patient Education  2020 [employer].

## 2019-07-28 NOTE — Interval H&P Note (Signed)
History and Physical Interval Note:  07/28/2019 10:46 AM  Linda Shepherd  has presented today for surgery, with the diagnosis of Acute cholecystitis.  The various methods of treatment have been discussed with the patient and family. After consideration of risks, benefits and other options for treatment, the patient has consented to  Procedure(s): LAPAROSCOPIC CHOLECYSTECTOMY WITH INTRAOPERATIVE CHOLANGIOGRAM (N/A) as a surgical intervention.  The patient's history has been reviewed, patient examined, no change in status, stable for surgery.  I have reviewed the patient's chart and labs.  Questions were answered to the patient's satisfaction.    The anatomy & physiology of hepatobiliary & pancreatic function was discussed.  The pathophysiology of gallbladder dysfunction was discussed.  Natural history risks without surgery was discussed.   I feel the risks of no intervention will lead to serious problems that outweigh the operative risks; therefore, I recommended cholecystectomy to remove the pathology.  I explained laparoscopic techniques with possible need for an open approach.  Probable cholangiogram to evaluate the bilary tract was explained as well.    Risks such as bleeding, infection, diarrhea and other bowel changes, abscess, leak, injury to other organs, need for repair of tissues / organs, need for further treatment, stroke, heart attack, death, and other risks were discussed.  I noted a good likelihood this will help address the problem, but there is a chance it may not help.  Possibility that this will not correct all abdominal symptoms was explained.  Goals of post-operative recovery were discussed as well.  We will work to minimize complications.  An educational handout further explaining the pathology and treatment options was given as well.  Questions were answered.  The patient expresses understanding & wishes to proceed with surgery.   I have re-reviewed the the patient's records,  history, medications, and allergies.  I have re-examined the patient.  I again discussed intraoperative plans and goals of post-operative recovery.  The patient agrees to proceed.  Linda Shepherd  [birth year] [id number]  Patient Care Team: Sharion Balloon, FNP as PCP - General (Family Medicine) Salvadore Dom, MD as Consulting Physician (Obstetrics and Gynecology)  Patient Active Problem List   Diagnosis Date Noted   Acute calculous cholecystitis 07/27/2019   BMI 45.0-49.9, adult ([place]) 01/16/2019   Prediabetes    Plantar fasciitis of left foot 08/26/2017   Pes planus, flexible 08/26/2017   IUD (intrauterine device) in place 11/14/2013    Past Medical History:  Diagnosis Date   Anemia    Prediabetes     Past Surgical History:  Procedure Laterality Date   BREAST BIOPSY  2000   benign   TUBAL LIGATION  2004   postpartum    Social History   Socioeconomic History   Marital status: Single    Spouse name: Not on file   Number of children: 4   Years of education: Not on file   Highest education level: Not on file  Occupational History   Occupation: works in Programmer, applications and as an Veterinary surgeon at [employer] Use   Smoking status: Never Smoker   Smokeless tobacco: Never Used  Substance and Sexual Activity   Alcohol use: No    Comment: occ wine   Drug use: No   Sexual activity: Yes    Partners: Male    Birth control/protection: I.U.D.  Other Topics Concern   Not on file  Social History Narrative   Not on file   Social Determinants of Health   Financial Resource  Strain:    Difficulty of Paying Living Expenses: Not on file  Food Insecurity:    Worried About [place] in the Last Year: Not on file   Ran Out of Food in the Last Year: Not on file  Transportation Needs:    Lack of Transportation (Medical): Not on file   Lack of Transportation (Non-Medical): Not on file  Physical Activity:    Days of Exercise per Week: Not on file    Minutes of Exercise per Session: Not on file  Stress:    Feeling of Stress : Not on file  Social Connections:    Frequency of Communication with Friends and Family: Not on file   Frequency of Social Gatherings with Friends and Family: Not on file   Attends Religious Services: Not on file   Active Member of Clubs or Organizations: Not on file   Attends Archivist Meetings: Not on file   Marital Status: Not on file  Intimate Partner Violence:    Fear of Current or Ex-Partner: Not on file   Emotionally Abused: Not on file   Physically Abused: Not on file   Sexually Abused: Not on file    Family History  Problem Relation Age of Onset   Diabetes Maternal Grandmother    Congestive Heart Failure Maternal Grandmother    Depression Mother    Stroke Mother 74   Breast cancer Cousin 45       paternal first cousin   Colon cancer Neg Hx     Medications Prior to Admission  Medication Sig Dispense Refill Last Dose   Cholecalciferol (VITAMIN D) 125 MCG (5000 UT) CAPS Take 5,000 Units by mouth daily.   Past Week at Unknown time   levonorgestrel (MIRENA) 20 MCG/24HR IUD 1 Intra Uterine Device (1 each total) by Intrauterine route once. 1 Intra Uterine Device 0     Current Facility-Administered Medications  Medication Dose Route Frequency Provider Last Rate Last Admin   0.9 %  sodium chloride infusion  1,000 mL Intravenous Continuous Charlesetta Shanks, MD   Stopped at 07/27/19 0916   [MAR Hold] 0.9 %  sodium chloride infusion   Intravenous PRN Charlesetta Shanks, MD   Stopped at 07/27/19 1044   [MAR Hold] 0.9 %  sodium chloride infusion   Intravenous Q8H PRN Michael Boston, MD       [MAR Hold] acetaminophen (TYLENOL) tablet 650 mg  650 mg Oral Q6H PRN Michael Boston, MD       Or   Doug Sou Hold] acetaminophen (TYLENOL) suppository 650 mg  650 mg Rectal Q6H PRN Michael Boston, MD       acetaminophen (TYLENOL) tablet 1,000 mg  1,000 mg Oral On Call to OR Michael Boston, MD       [facility] Hold] alum &  mag hydroxide-simeth (MAALOX/MYLANTA) 200-200-20 MG/5ML suspension 30 mL  30 mL Oral Q6H PRN Michael Boston, MD       [facility] Hold] bisacodyl (DULCOLAX) suppository 10 mg  10 mg Rectal Q12H PRN Michael Boston, MD       [facility] Hold] bupivacaine liposome (EXPAREL) 1.3 % injection 266 mg  20 mL Infiltration Once Michael Boston, MD       Doug [facility] Hold] cefTRIAXone (ROCEPHIN) 2 g in sodium chloride 0.9 % 100 mL IVPB  2 g Intravenous Q24H Michael Boston, MD 200 mL/hr at 07/27/19 2149 2 g at 07/27/19 2149   celecoxib (CELEBREX) capsule 400 mg  400 mg Oral On Call to OR Michael Boston, MD       [  MAR Hold] diphenhydrAMINE (BENADRYL) 12.5 MG/5ML elixir 12.5 mg  12.5 mg Oral Q6H PRN Michael Boston, MD       Or   Doug Sou Hold] diphenhydrAMINE (BENADRYL) injection 12.5 mg  12.5 mg Intravenous Q6H PRN Michael Boston, MD       [facility] Hold] enalaprilat (VASOTEC) injection 0.625-1.25 mg  0.625-1.25 mg Intravenous Q6H PRN Michael Boston, MD       [MAR Hold] enoxaparin (LOVENOX) injection 40 mg  40 mg Subcutaneous Q24H Michael Boston, MD       [facility] Hold] famotidine (PEPCID) IVPB 20 mg premix  20 mg Intravenous Q12H Charlesetta Shanks, MD 100 mL/hr at 07/27/19 2333 20 mg at 07/27/19 2333   gabapentin (NEURONTIN) capsule 300 mg  300 mg Oral On Call to OR Michael Boston, MD       [MAR Hold] guaiFENesin-dextromethorphan (ROBITUSSIN DM) 100-10 MG/5ML syrup 10 mL  10 mL Oral Q4H PRN Michael Boston, MD       [facility] Hold] hydrocortisone (ANUSOL-HC) 2.5 % rectal cream 1 application  1 application Topical QID PRN Michael Boston, MD       Doug Sou Hold] hydrocortisone cream 1 % 1 application  1 application Topical TID PRN Michael Boston, MD       [facility] Hold] HYDROmorphone (DILAUDID) injection 0.5-2 mg  0.5-2 mg Intravenous Q2H PRN Michael Boston, MD   1 mg at 07/28/19 0232   [MAR Hold] lactated ringers bolus 1,000 mL  1,000 mL Intravenous Q8H PRN Michael Boston, MD       lactated ringers infusion   Intravenous Continuous Michael Boston, MD 100 mL/hr at 07/28/19 0930  Rate Verify at 07/28/19 0930   [MAR Hold] lip balm (CARMEX) ointment 1 application  1 application Topical BID Michael Boston, MD   1 application at 90/24/09 2334   [MAR Hold] magic mouthwash  15 mL Oral QID PRN Michael Boston, MD       [facility] Hold] menthol-cetylpyridinium (CEPACOL) lozenge 3 mg  1 lozenge Oral PRN Michael Boston, MD       [facility] Hold] methocarbamol (ROBAXIN) 1,000 mg in dextrose 5 % 100 mL IVPB  1,000 mg Intravenous Q6H PRN Michael Boston, MD       [facility] Hold] metoprolol tartrate (LOPRESSOR) injection 5 mg  5 mg Intravenous Q6H PRN Michael Boston, MD       metroNIDAZOLE (FLAGYL) IVPB 500 mg  500 mg Intravenous On Call to OR Michael Boston, MD       [facility] Hold] morphine 4 MG/ML injection 4 mg  4 mg Intravenous Q3H PRN Charlesetta Shanks, MD   4 mg at 07/27/19 0853   [MAR Hold] ondansetron (ZOFRAN) injection 4 mg  4 mg Intravenous Q6H PRN Michael Boston, MD   4 mg at 07/28/19 0232   Or   [MAR Hold] ondansetron (ZOFRAN) 8 mg in sodium chloride 0.9 % 50 mL IVPB  8 mg Intravenous Q6H PRN Michael Boston, MD       Doug Sou Hold] ondansetron [facility]) injection 4 mg  4 mg Intravenous Q6H PRN Charlesetta Shanks, MD   4 mg at 07/27/19 1525   [MAR Hold] oxyCODONE (Oxy IR/ROXICODONE) immediate release tablet 5-10 mg  5-10 mg Oral Q4H PRN Michael Boston, MD       [facility] Hold] phenol (CHLORASEPTIC) mouth spray 1-2 spray  1-2 spray Mouth/Throat PRN Michael Boston, MD       [facility] Hold] prochlorperazine (COMPAZINE) injection 5-10 mg  5-10 mg Intravenous Q4H PRN Michael Boston, MD       [  MAR Hold] simethicone (MYLICON) chewable tablet 40 mg  40 mg Oral Q6H PRN Michael Boston, MD         No Known Allergies  BP (!) 108/51 (BP Location: Left Arm)   Pulse 66   Temp 98.2 F (36.8 C) (Oral)   Resp 18   Ht 5' 3"  (1.6 m)   Wt 115 kg   SpO2 98%   BMI 44.91 kg/m   Labs: Results for orders placed or performed during the hospital encounter of 07/27/19 (from the past 48 hour(s))  CBC with Differential     Status: None    Collection Time: 07/27/19  5:35 AM  Result Value Ref Range   WBC 8.2 4.0 - 10.5 K/uL   RBC 4.20 3.87 - 5.11 MIL/uL   Hemoglobin 12.1 12.0 - 15.0 g/dL   HCT 37.7 36.0 - 46.0 %   MCV 89.8 80.0 - 100.0 fL   MCH 28.8 26.0 - 34.0 pg   MCHC 32.1 30.0 - 36.0 g/dL   RDW 13.6 11.5 - 15.5 %   Platelets 227 150 - 400 K/uL   nRBC 0.0 0.0 - 0.2 %   Neutrophils Relative % 56 %   Neutro Abs 4.6 1.7 - 7.7 K/uL   Lymphocytes Relative 32 %   Lymphs Abs 2.6 0.7 - 4.0 K/uL   Monocytes Relative 8 %   Monocytes Absolute 0.7 0.1 - 1.0 K/uL   Eosinophils Relative 2 %   Eosinophils Absolute 0.2 0.0 - 0.5 K/uL   Basophils Relative 1 %   Basophils Absolute 0.1 0.0 - 0.1 K/uL   Immature Granulocytes 1 %   Abs Immature Granulocytes 0.07 0.00 - 0.07 K/uL    Comment: Performed at [facility], [address] [place], [place] [address]  Comprehensive metabolic panel     Status: Abnormal   Collection Time: 07/27/19  5:35 AM  Result Value Ref Range   Sodium 137 135 - 145 mmol/L   Potassium 4.6 3.5 - 5.1 mmol/L   Chloride 105 98 - 111 mmol/L   CO2 25 22 - 32 mmol/L   Glucose, Bld 105 (H) 70 - 99 mg/dL   BUN 15 6 - 20 mg/dL   Creatinine, Ser 0.72 0.44 - 1.00 mg/dL   Calcium 9.6 8.9 - 10.3 mg/dL   Total Protein 7.1 6.5 - 8.1 g/dL   Albumin 3.6 3.5 - 5.0 g/dL   AST 18 15 - 41 U/L   ALT 18 0 - 44 U/L   Alkaline Phosphatase 60 38 - 126 U/L   Total Bilirubin 0.4 0.3 - 1.2 mg/dL   GFR calc non Af Amer >60 >60 mL/min   GFR calc Af Amer >60 >60 mL/min   Anion gap 7 5 - 15    Comment: Performed at [facility], [address] [place], [place] [address]  Lipase, blood     Status: None   Collection Time: 07/27/19  5:35 AM  Result Value Ref Range   Lipase 35 11 - 51 U/L    Comment: Performed at [facility], [address] [place], [place] [address]  Urinalysis, Routine w reflex microscopic     Status: Abnormal   Collection Time: 07/27/19  5:46 AM  Result Value Ref Range    Color, Urine STRAW (A) YELLOW   APPearance CLEAR CLEAR   Specific Gravity, Urine 1.025 1.005 - 1.030   pH 5.5 5.0 - 8.0   Glucose, UA NEGATIVE  NEGATIVE mg/dL   Hgb urine dipstick NEGATIVE NEGATIVE   Bilirubin Urine NEGATIVE NEGATIVE   Ketones, ur NEGATIVE NEGATIVE mg/dL   Protein, ur NEGATIVE NEGATIVE mg/dL   Nitrite NEGATIVE NEGATIVE   Leukocytes,Ua NEGATIVE NEGATIVE    Comment: Microscopic not done on urines with negative protein, blood, leukocytes, nitrite, or glucose < 500 mg/dL. Performed at [facility], [address] [place], [place] [address]   Pregnancy, urine     Status: None   Collection Time: 07/27/19  5:46 AM  Result Value Ref Range   Preg Test, Ur NEGATIVE NEGATIVE    Comment:        THE SENSITIVITY OF THIS METHODOLOGY IS >20 mIU/mL. Performed at [facility], [address] [place], [place] [address]   Lactic acid, plasma     Status: None   Collection Time: 07/27/19  8:16 AM  Result Value Ref Range   Lactic Acid, Venous 0.8 0.5 - 1.9 mmol/L    Comment: Performed at [facility], [address] [place], [place] [address]  SARS CORONAVIRUS 2 (TAT 6-24 HRS) Nasopharyngeal Nasopharyngeal Swab     Status: None   Collection Time: 07/27/19  9:02 AM   Specimen: Nasopharyngeal Swab  Result Value Ref Range   SARS Coronavirus 2 NEGATIVE NEGATIVE    Comment: (NOTE) SARS-CoV-2 target nucleic acids are NOT DETECTED. The SARS-CoV-2 RNA is generally detectable in upper and lower respiratory specimens during the acute phase of infection. Negative results do not preclude SARS-CoV-2 infection, do not rule out co-infections with other pathogens, and should not be used as the sole basis for treatment or other patient management decisions. Negative results must be combined with clinical observations, patient history, and epidemiological information. The expected result is Negative. Fact Sheet for  Patients: SugarRoll.be Fact Sheet for Healthcare Providers: https://www.woods-mathews.com/ This test is not yet approved or cleared by the [place] FDA and  has been authorized for detection and/or diagnosis of SARS-CoV-2 by FDA under an Emergency Use Authorization (EUA). This EUA will remain  in effect (meaning this test can be used) for the duration of the COVID-19 declaration under Section 56 4(b)(1) of the Act, 21 U.S.C. section 360bbb-3(b)(1), unless the authorization is terminated or revoked sooner. Performed at [facility], [place] [address] [place], [place] [address]   Surgical PCR screen     Status: None   Collection Time: 07/27/19 11:42 PM   Specimen: Nasal Mucosa; Nasal Swab  Result Value Ref Range   MRSA, PCR NEGATIVE NEGATIVE   Staphylococcus aureus NEGATIVE NEGATIVE    Comment: (NOTE) The Xpert SA Assay (FDA approved for NASAL specimens in patients 67 years of age and older), is one component of a comprehensive surveillance program. It is not intended to diagnose infection nor to guide or monitor treatment. Performed at [facility], [place] [address], [place], [place] [address]     Imaging / Studies: CT ABDOMEN PELVIS W CONTRAST  Result Date: 07/27/2019 CLINICAL DATA:  Generalized abdominal pain. Diarrhea. Right upper quadrant cramping. EXAM: CT ABDOMEN AND PELVIS WITH CONTRAST TECHNIQUE: Multidetector CT imaging of the abdomen and pelvis was performed using the standard protocol following bolus administration of intravenous contrast. CONTRAST:  168m OMNIPAQUE IOHEXOL 300 MG/ML  SOLN COMPARISON:  CT abdomen and pelvis without and with contrast 05/18/2010 FINDINGS: Lower chest: Lung bases are clear without focal nodule, mass, or airspace disease. The heart size is normal. No significant pleural or  pericardial effusion is present. Hepatobiliary: A peripherally calcified gallstone is present at the neck  of the gallbladder. Mild inflammatory changes are present about the gallbladder. The common bile duct and gallbladder are normal. Liver is unremarkable. Pancreas: Unremarkable. No pancreatic ductal dilatation or surrounding inflammatory changes. Spleen: Normal in size without focal abnormality. Adrenals/Urinary Tract: Adrenal glands are unremarkable. Kidneys are normal, without renal calculi, focal lesion, or hydronephrosis. Bladder is unremarkable. Stomach/Bowel: Stomach and duodenum are within normal limits. Small bowel is normal. The appendix is visualized and normal. Ascending and transverse colon are within normal limits. The descending and sigmoid colon are normal. Vascular/Lymphatic: No significant vascular findings are present. No enlarged abdominal or pelvic lymph nodes. Reproductive: Clips are present in the fallopian tubes bilaterally. IUD is in place. There is heterogeneous enlargement of the cervix measuring 3.8 x 6.1 x 4.7 cm. Uterus is otherwise unremarkable. Adnexa are within normal limits. Other: No abdominal wall hernia or abnormality. No abdominopelvic ascites. Musculoskeletal: Slight degenerative retrolisthesis is present at C2-3 and C3-4. Superior endplate Schmorl's nodes are present at L1, L2, and L3. Vertebral body heights are maintained. Alignment is otherwise anatomic. The bony pelvis is within normal limits. Hips are located and within normal limits bilaterally. IMPRESSION: 1. Peripherally calcified gallstone in the neck of the gallbladder measures 1.9 cm. 2. Mild inflammatory changes about the gallbladder raise concern for acute cholecystitis. Right upper quadrant ultrasound may be useful for further evaluation. 3. Enlarged heterogeneous cervix is concerning for malignancy. Recommend GYN referral. Electronically Signed   By: San Morelle M.D.   On: 07/27/2019 07:27     .Adin Hector, M.D., F.A.C.S. Gastrointestinal and Minimally Invasive Surgery [facility] [address] [place], [place] [address] [phone] Main / Paging  07/28/2019 10:47 AM    Adin Hector

## 2019-07-28 NOTE — Anesthesia Preprocedure Evaluation (Signed)
Anesthesia Evaluation  Patient identified by MRN, date of birth, ID band Patient awake    Reviewed: Allergy & Precautions, H&P , NPO status , Patient's Chart, lab work & pertinent test results  Airway Mallampati: II   Neck ROM: full    Dental   Pulmonary neg pulmonary ROS,    breath sounds clear to auscultation       Cardiovascular negative cardio ROS   Rhythm:regular Rate:Normal     Neuro/Psych    GI/Hepatic Acute cholecystitis   Endo/Other  Morbid obesity  Renal/GU      Musculoskeletal   Abdominal   Peds  Hematology   Anesthesia Other Findings   Reproductive/Obstetrics                             Anesthesia Physical Anesthesia Plan  ASA: II  Anesthesia Plan: General   Post-op Pain Management:    Induction: Intravenous  PONV Risk Score and Plan: 3 and Ondansetron, Dexamethasone, Midazolam, Treatment may vary due to age or medical condition and Scopolamine patch - Pre-op  Airway Management Planned: Oral ETT  Additional Equipment:   Intra-op Plan:   Post-operative Plan: Extubation in OR  Informed Consent: I have reviewed the patients History and Physical, chart, labs and discussed the procedure including the risks, benefits and alternatives for the proposed anesthesia with the patient or authorized representative who has indicated his/her understanding and acceptance.       Plan Discussed with: CRNA, Anesthesiologist and Surgeon  Anesthesia Plan Comments:         Anesthesia Quick Evaluation

## 2019-07-28 NOTE — Op Note (Signed)
07/28/2019  PATIENT:  Linda Shepherd  43 y.o. female  Patient Care Team: Sharion Balloon, FNP as PCP - General (Family Medicine) Salvadore Dom, MD as Consulting Physician (Obstetrics and Gynecology)  PRE-OPERATIVE DIAGNOSIS:    Acute on Chronic Calculus Cholecystitis  POST-OPERATIVE DIAGNOSIS:   Acute on Chronic Calculus Cholecystitis Liver: Fatty steatohepatitis  PROCEDURE:  Laparoscopic cholecystectomy with intraoperative cholangiogram  SURGEON:  Adin Hector, MD, FACS.  ASSISTANT: Armandina Gemma, MD An experienced assistant was required given the standard of surgical care given the complexity of the case & morbid obesity.  This assistant was needed for exposure, dissection, suctioning, retraction, instrument exchange, etc.  ANESTHESIA:    General with endotracheal intubation Local anesthetic as a field block  EBL:  (See Anesthesia Intraoperative Record) Total I/O In: 1400 [I.V.:1250; IV Piggyback:150] Out: 10 [Blood:10]   Delay start of Pharmacological VTE agent (>24hrs) due to surgical blood loss or risk of bleeding:  no  DRAINS: None   SPECIMEN: Gallbladder    DISPOSITION OF SPECIMEN:  PATHOLOGY  COUNTS:  YES  PLAN OF CARE: Discharge to home after PACU  PATIENT DISPOSITION:  PACU - hemodynamically stable.  INDICATION: Pleasant morbidly obese woman with attack of biliary colic and obvious gallstone.  Evidence of inflammation suspicious for early cholecystitis on CAT scan persistent pain.  Not able to be controlled emergency room.  Recommendation made for admission and cholecystectomy given presumed acute on chronic cholecystitis  The anatomy & physiology of hepatobiliary & pancreatic function was discussed.  The pathophysiology of gallbladder dysfunction was discussed.  Natural history risks without surgery was discussed.   I feel the risks of no intervention will lead to serious problems that outweigh the operative risks; therefore, I recommended  cholecystectomy to remove the pathology.  I explained laparoscopic techniques with possible need for an open approach.  Probable cholangiogram to evaluate the bilary tract was explained as well.    Risks such as bleeding, infection, abscess, leak, injury to other organs, need for further treatment, heart attack, death, and other risks were discussed.  I noted a good likelihood this will help address the problem.  Possibility that this will not correct all abdominal symptoms was explained.  Goals of post-operative recovery were discussed as well.  We will work to minimize complications.  An educational handout further explaining the pathology and treatment options was given as well.  Questions were answered.  The patient expresses understanding & wishes to proceed with surgery.  OR FINDINGS: Chronic thickening consistent with chronic cholecystitis with impacted stone.  Some edema suspicious for acute on chronic cholecystitis.  Cholangiogram with rather classic biliary anatomy.  No choledocholithiasis or biliary dilatation.  No leak.  Liver: Fatty steatohepatitis somewhat enlarged but no strong evidence of cirrhosis  DESCRIPTION:   Informed consent was confirmed.  The patient underwent general anaesthesia without difficulty.  The patient was positioned appropriately.  VTE prevention in place.  The patient's abdomen was clipped, prepped, & draped in a sterile fashion.  Surgical timeout confirmed our plan.  Peritoneal entry with a laparoscopic port was obtained using optical entry technique in the right upper abdomen as the patient was positioned in reverse Trendelenburg.  Entry was clean.  I induced carbon dioxide insufflation.  Camera inspection revealed no injury.  Extra ports were carefully placed under direct laparoscopic visualization.  I turned attention to the right upper quadrant.  Gallbladder was enlarged and thickened with thickening and edema consistent with acute on chronic cholecystitis the  gallbladder fundus  was elevated cephalad. I used hook cautery to free the peritoneal coverings between the gallbladder and the liver on the posteriolateral and anteriomedial walls.   I used careful blunt and hook dissection to help get a good critical view of the cystic artery and cystic duct. I did further dissection to free 50%of the gallbladder off the liver bed to get a good critical view of the infundibulum and cystic duct.  I dissected out the cystic artery; and, after getting a good 360 view, ligated the anterior & posterior branches of the cystic artery close on the infundibulum using the Harmonic ultrasonic dissection.  I skeletonized the cystic duct.  I placed a clip on the infundibulum. I did a partial cystic duct-otomy and ensured patency. I placed a 5 [place] cholangiocatheter through a puncture site at the right subcostal ridge of the abdominal wall and directed it into the cystic duct.  We ran a cholangiogram with dilute radio-opaque contrast and continuous fluoroscopy. Contrast flowed from a side branch consistent with cystic duct cannulization. Contrast flowed up the common hepatic duct into the right and left intrahepatic chains out to secondary radicals. Contrast flowed down the common bile duct easily across the normal ampulla into the duodenum.  This was consistent with a normal cholangiogram.  I removed the cholangiocatheter. I placed clips on the cystic duct x4.  I completed cystic duct transection. I freed the gallbladder from its remaining attachments to the liver. I ensured hemostasis on the gallbladder fossa of the liver and elsewhere. I inspected the rest of the abdomen & detected no injury nor bleeding elsewhere.  Placed the gallbladder inside and ecosac bag and pulled out the subxiphoid port with some gradual dilation given the large stone.  Able to be removed.  I closed the subxiphoid fascia transversely using #1 PDS interrupted stitch using a laparoscopic suture passer under  direct laparoscopic guidance.   I closed the skin using 4-0 monocryl stitch.  Sterile dressings were applied. The patient was extubated & arrived in the PACU in stable condition..  I had discussed postoperative care with the patient in the holding area. I discussed operative findings, updated the patient's status, discussed probable steps to recovery, and gave postoperative recommendations to the patient's son.  Recommendations were made.  Questions were answered.  He expressed understanding & appreciation.  Adin Hector, M.D., F.A.C.S. Gastrointestinal and Minimally Invasive Surgery [facility] [address], [place], [place] [address] [phone] Main / Paging  07/28/2019 12:21 PM

## 2019-07-28 NOTE — Transfer of Care (Signed)
Immediate Anesthesia Transfer of Care Note  Patient: Linda Shepherd  Procedure(s) Performed: LAPAROSCOPIC CHOLECYSTECTOMY WITH INTRAOPERATIVE CHOLANGIOGRAM (N/A Abdomen)  Patient Location: PACU  Anesthesia Type:General  Level of Consciousness: awake, alert  and oriented  Airway & Oxygen Therapy: Patient Spontanous Breathing and Patient connected to face mask oxygen  Post-op Assessment: Report given to RN and Post -op Vital signs reviewed and stable  Post vital signs: Reviewed and stable  Last Vitals:  Vitals Value Taken Time  BP 132/89 07/28/19 1221  Temp    Pulse 89 07/28/19 1222  Resp 22 07/28/19 1222  SpO2 98 % 07/28/19 1222  Vitals shown include unvalidated device data.  Last Pain:  Vitals:   07/28/19 0516  TempSrc: Oral  PainSc:       Patients Stated Pain Goal: 3 (44/03/47 [id number])  Complications: No apparent anesthesia complications

## 2019-07-29 NOTE — Telephone Encounter (Signed)
Please let the patient know that I reviewed the CT and report. We should do a pap to be safe. I also reviewed her prior normal pap and negative HPV from 8/17 and that is very reassuring

## 2019-07-29 NOTE — Discharge Summary (Signed)
Physician Discharge Summary    Patient ID: Linda Shepherd MRN: [id number] DOB/AGE: 02-12-1976  43 y.o.  Patient Care Team: Sharion Balloon, FNP as PCP - General (Family Medicine) Salvadore Dom, MD as Consulting Physician (Obstetrics and Gynecology)  Admit date: 07/27/2019  Discharge date: 07/29/2019  Hospital Stay = 2 days    Discharge Diagnoses:  Principal Problem:   Acute calculous cholecystitis Active Problems:   IUD (intrauterine device) in place   Prediabetes   BMI 45.0-49.9, adult ([place])   1 Day Post-Op  07/28/2019  POST-OPERATIVE DIAGNOSIS:   Acute on Chronic Calculus Cholecystitis Liver: Fatty steatohepatitis  PROCEDURE:  Laparoscopic cholecystectomy with intraoperative cholangiogram  SURGEON:  Adin Hector, MD, FACS.  ASSISTANT: Armandina Gemma, MD  Consults: None  Hospital Course:   The patient underwent the surgery above.  Postoperatively, the patient gradually mobilized and advanced to a solid diet.  Pain and other symptoms were treated aggressively.    By the time of discharge, the patient was walking well the hallways, eating food, having flatus.  Pain was well-controlled on an oral medications.  Based on meeting discharge criteria and continuing to recover, I felt it was safe for the patient to be discharged from the hospital to further recover with close followup. Postoperative recommendations were discussed in detail.  They are written as well.  Discharged Condition: good  Discharge Exam: Blood pressure 118/65, pulse 77, temperature 98.3 F (36.8 C), temperature source Oral, resp. rate 16, height 5' 3"  (1.6 m), weight 115 kg, SpO2 96 %.  General: Pt awake/alert/oriented x4 in No acute distress Eyes: PERRL, normal EOM.  Sclera clear.  No icterus Neuro: CN II-XII intact w/o focal sensory/motor deficits. Lymph: No head/neck/groin lymphadenopathy Psych:  No delerium/psychosis/paranoia HENT: Normocephalic, Mucus membranes moist.  No  thrush Neck: Supple, No tracheal deviation Chest: No chest wall pain w good excursion CV:  Pulses intact.  Regular rhythm MS: Normal AROM mjr joints.  No obvious deformity Abdomen: Soft.  Nondistended.  Mildly tender at incisions only.  No evidence of peritonitis.  No incarcerated hernias. Ext:  SCDs BLE.  No mjr edema.  No cyanosis Skin: No petechiae / purpura   Disposition:   Follow-up Information    Salvadore Dom, MD. Schedule an appointment as soon as possible for a visit in 2 week(s).   Specialty: Obstetrics and Gynecology Why: to discuss cervix abnormality on CT Contact information: [place] [address] [phone]        [facility], [place]. Schedule an appointment as soon as possible for a visit in 3 weeks.   Specialty: General Surgery Why: To follow up after your operation, To follow up after your hospital stay Contact information: [address] [place] [phone]          Discharge disposition: 01-Home or Self Care       Discharge Instructions    Call MD for:   Complete by: As directed    FEVER > 101.5 F  (temperatures < 101.5 F are not significant)   Call MD for:  extreme fatigue   Complete by: As directed    Call MD for:  persistant dizziness or light-headedness   Complete by: As directed    Call MD for:  persistant nausea and vomiting   Complete by: As directed    Call MD for:  redness, tenderness, or signs of infection (pain, swelling, redness, odor or green/yellow discharge around incision  site)   Complete by: As directed    Call MD for:  severe uncontrolled pain   Complete by: As directed    Diet - low sodium heart healthy   Complete by: As directed    Start with a bland diet such as soups, liquids, starchy foods, low fat foods, etc. the first few days at home. Gradually advance to a solid, low-fat, high fiber diet by the end of the first week at  home.   Add a fiber supplement to your diet (Metamucil, etc) If you feel full, bloated, or constipated, stay on a full liquid or pureed/blenderized diet for a few days until you feel better and are no longer constipated.   Discharge instructions   Complete by: As directed    See Discharge Instructions If you are not getting better after two weeks or are noticing you are getting worse, contact our office [phone] for further advice.  We may need to adjust your medications, re-evaluate you in the office, send you to the emergency room, or see what other things we can do to help. The clinic staff is available to answer your questions during regular business hours (8:30am-5pm).  Please don't hesitate to call and ask to speak to one of our nurses for clinical concerns.    A surgeon from [facility] is always on call at the hospitals 24 hours/day If you have a medical emergency, go to the nearest emergency room or call 911.   Discharge wound care:   Complete by: As directed    It is good for closed incisions and even open wounds to be washed every day.  Shower every day.  Short baths are fine.  Wash the incisions and wounds clean with soap & water.    You may leave closed incisions open to air if it is dry.   You may cover the incision with clean gauze & replace it after your daily shower for comfort.  Remove tegaderm dressings 5 days after surgery   Driving Restrictions   Complete by: As directed    You may drive when: - you are no longer taking narcotic prescription pain medication - you can comfortably wear a seatbelt - you can safely make sudden turns/stops without pain.   Increase activity slowly   Complete by: As directed    Start light daily activities --- self-care, walking, climbing stairs- beginning the day after surgery.  Gradually increase activities as tolerated.  Control your pain to be active.  Stop when you are tired.  Ideally, walk several times a day, eventually  an hour a day.   Most people are back to most day-to-day activities in a few weeks.  It takes 4-6 weeks to get back to unrestricted, intense activity. If you can walk 30 minutes without difficulty, it is safe to try more intense activity such as jogging, treadmill, bicycling, low-impact aerobics, swimming, etc. Save the most intensive and strenuous activity for last (Usually 4-8 weeks after surgery) such as sit-ups, heavy lifting, contact sports, etc.  Refrain from any intense heavy lifting or straining until you are off narcotics for pain control.  You will have off days, but things should improve week-by-week. DO NOT PUSH THROUGH PAIN.  Let pain be your guide: If it hurts to do something, don't do it.   Lifting restrictions   Complete by: As directed    If you can walk 30 minutes without difficulty, it is safe to try more intense activity such as  jogging, treadmill, bicycling, low-impact aerobics, swimming, etc. Save the most intensive and strenuous activity for last (Usually 4-8 weeks after surgery) such as sit-ups, heavy lifting, contact sports, etc.   Refrain from any intense heavy lifting or straining until you are off narcotics for pain control.  You will have off days, but things should improve week-by-week. DO NOT PUSH THROUGH PAIN.  Let pain be your guide: If it hurts to do something, don't do it.  Pain is your body warning you to avoid that activity for another week until the pain goes down.   May shower / Bathe   Complete by: As directed    May walk up steps   Complete by: As directed    Remove dressing in 72 hours   Complete by: As directed    Make sure all dressings are removed by the third day after surgery.  Leave incisions open to air.  OK to cover incisions with gauze or bandages as desired   Sexual Activity Restrictions   Complete by: As directed    You may have sexual intercourse when it is comfortable. If it hurts to do something, stop.      Allergies as of 07/29/2019    No Known Allergies     Medication List    TAKE these medications   levonorgestrel 20 MCG/24HR IUD Commonly known as: MIRENA 1 Intra Uterine Device (1 each total) by Intrauterine route once.   methocarbamol 750 MG tablet Commonly known as: ROBAXIN Take 1 tablet (750 mg total) by mouth 4 (four) times daily as needed (use for muscle cramps/pain).   ondansetron 4 MG tablet Commonly known as: ZOFRAN Take 1 tablet (4 mg total) by mouth every 8 (eight) hours as needed for nausea.   traMADol 50 MG tablet Commonly known as: ULTRAM Take 1-2 tablets (50-100 mg total) by mouth every 6 (six) hours as needed for moderate pain or severe pain.   Vitamin D 125 MCG (5000 UT) Caps Take 5,000 Units by mouth daily.            Discharge Care Instructions  (From admission, onward)         Start     Ordered   07/28/19 0000  Discharge wound care:    Comments: It is good for closed incisions and even open wounds to be washed every day.  Shower every day.  Short baths are fine.  Wash the incisions and wounds clean with soap & water.    You may leave closed incisions open to air if it is dry.   You may cover the incision with clean gauze & replace it after your daily shower for comfort.  Remove tegaderm dressings 5 days after surgery   07/28/19 1212          Significant Diagnostic Studies:  Results for orders placed or performed during the hospital encounter of 07/27/19 (from the past 72 hour(s))  CBC with Differential     Status: None   Collection Time: 07/27/19  5:35 AM  Result Value Ref Range   WBC 8.2 4.0 - 10.5 K/uL   RBC 4.20 3.87 - 5.11 MIL/uL   Hemoglobin 12.1 12.0 - 15.0 g/dL   HCT 37.7 36.0 - 46.0 %   MCV 89.8 80.0 - 100.0 fL   MCH 28.8 26.0 - 34.0 pg   MCHC 32.1 30.0 - 36.0 g/dL   RDW 13.6 11.5 - 15.5 %   Platelets 227 150 - 400 K/uL   nRBC 0.0 0.0 -  0.2 %   Neutrophils Relative % 56 %   Neutro Abs 4.6 1.7 - 7.7 K/uL   Lymphocytes Relative 32 %   Lymphs Abs 2.6 0.7 -  4.0 K/uL   Monocytes Relative 8 %   Monocytes Absolute 0.7 0.1 - 1.0 K/uL   Eosinophils Relative 2 %   Eosinophils Absolute 0.2 0.0 - 0.5 K/uL   Basophils Relative 1 %   Basophils Absolute 0.1 0.0 - 0.1 K/uL   Immature Granulocytes 1 %   Abs Immature Granulocytes 0.07 0.00 - 0.07 K/uL    Comment: Performed at [facility], [address] [place], [place] [address]  Comprehensive metabolic panel     Status: Abnormal   Collection Time: 07/27/19  5:35 AM  Result Value Ref Range   Sodium 137 135 - 145 mmol/L   Potassium 4.6 3.5 - 5.1 mmol/L   Chloride 105 98 - 111 mmol/L   CO2 25 22 - 32 mmol/L   Glucose, Bld 105 (H) 70 - 99 mg/dL   BUN 15 6 - 20 mg/dL   Creatinine, Ser 0.72 0.44 - 1.00 mg/dL   Calcium 9.6 8.9 - 10.3 mg/dL   Total Protein 7.1 6.5 - 8.1 g/dL   Albumin 3.6 3.5 - 5.0 g/dL   AST 18 15 - 41 U/L   ALT 18 0 - 44 U/L   Alkaline Phosphatase 60 38 - 126 U/L   Total Bilirubin 0.4 0.3 - 1.2 mg/dL   GFR calc non Af Amer >60 >60 mL/min   GFR calc Af Amer >60 >60 mL/min   Anion gap 7 5 - 15    Comment: Performed at [facility], [address] [place], [place] [address]  Lipase, blood     Status: None   Collection Time: 07/27/19  5:35 AM  Result Value Ref Range   Lipase 35 11 - 51 U/L    Comment: Performed at [facility], [address] [place], [place] [address]  Urinalysis, Routine w reflex microscopic     Status: Abnormal   Collection Time: 07/27/19  5:46 AM  Result Value Ref Range   Color, Urine STRAW (A) YELLOW   APPearance CLEAR CLEAR   Specific Gravity, Urine 1.025 1.005 - 1.030   pH 5.5 5.0 - 8.0   Glucose, UA NEGATIVE NEGATIVE mg/dL   Hgb urine dipstick NEGATIVE NEGATIVE   Bilirubin Urine NEGATIVE NEGATIVE   Ketones, ur NEGATIVE NEGATIVE mg/dL   Protein, ur NEGATIVE NEGATIVE mg/dL   Nitrite NEGATIVE NEGATIVE   Leukocytes,Ua NEGATIVE NEGATIVE    Comment: Microscopic not done on urines with negative protein, blood,  leukocytes, nitrite, or glucose < 500 mg/dL. Performed at [facility], [address] [place], [place] [address]   Pregnancy, urine     Status: None   Collection Time: 07/27/19  5:46 AM  Result Value Ref Range   Preg Test, Ur NEGATIVE NEGATIVE    Comment:        THE SENSITIVITY OF THIS METHODOLOGY IS >20 mIU/mL. Performed at [facility], [address] [place], [place] [address]   Lactic acid, plasma     Status: None   Collection Time: 07/27/19  8:16 AM  Result Value Ref Range   Lactic Acid, Venous 0.8 0.5 - 1.9 mmol/L    Comment: Performed at [facility], [address] [place], [place] [address]  SARS CORONAVIRUS 2 (TAT 6-24  HRS) Nasopharyngeal Nasopharyngeal Swab     Status: None   Collection Time: 07/27/19  9:02 AM   Specimen: Nasopharyngeal Swab  Result Value Ref Range   SARS Coronavirus 2 NEGATIVE NEGATIVE    Comment: (NOTE) SARS-CoV-2 target nucleic acids are NOT DETECTED. The SARS-CoV-2 RNA is generally detectable in upper and lower respiratory specimens during the acute phase of infection. Negative results do not preclude SARS-CoV-2 infection, do not rule out co-infections with other pathogens, and should not be used as the sole basis for treatment or other patient management decisions. Negative results must be combined with clinical observations, patient history, and epidemiological information. The expected result is Negative. Fact Sheet for Patients: SugarRoll.be Fact Sheet for Healthcare Providers: https://www.woods-mathews.com/ This test is not yet approved or cleared by the [place] FDA and  has been authorized for detection and/or diagnosis of SARS-CoV-2 by FDA under an Emergency Use Authorization (EUA). This EUA will remain  in effect (meaning this test can be used) for the duration of the COVID-19 declaration under Section 56 4(b)(1) of the Act, 21 U.S.C. section  360bbb-3(b)(1), unless the authorization is terminated or revoked sooner. Performed at [facility], [place] [address] [place], [place] [address]   Surgical PCR screen     Status: None   Collection Time: 07/27/19 11:42 PM   Specimen: Nasal Mucosa; Nasal Swab  Result Value Ref Range   MRSA, PCR NEGATIVE NEGATIVE   Staphylococcus aureus NEGATIVE NEGATIVE    Comment: (NOTE) The Xpert SA Assay (FDA approved for NASAL specimens in patients 80 years of age and older), is one component of a comprehensive surveillance program. It is not intended to diagnose infection nor to guide or monitor treatment. Performed at [facility], [place] [address], [place], [place] [address]     DG Cholangiogram Operative  Result Date: 07/28/2019 CLINICAL DATA:  Cholecystectomy for cholelithiasis and cholecystitis. EXAM: INTRAOPERATIVE CHOLANGIOGRAM TECHNIQUE: Cholangiographic images from the C-arm fluoroscopic device were submitted for interpretation post-operatively. Please see the procedural report for the amount of contrast and the fluoroscopy time utilized. COMPARISON:  CT of the abdomen and pelvis on 07/27/2019 FINDINGS: Intraoperative cholangiogram demonstrates normal opacified bile ducts without evidence of biliary dilatation, filling defect or obstruction. Contrast enters the duodenum normally. No contrast extravasation. IMPRESSION: Normal intraoperative cholangiogram. Electronically Signed   By: Aletta Edouard M.D.   On: 07/28/2019 12:47   CT ABDOMEN PELVIS W CONTRAST  Result Date: 07/27/2019 CLINICAL DATA:  Generalized abdominal pain. Diarrhea. Right upper quadrant cramping. EXAM: CT ABDOMEN AND PELVIS WITH CONTRAST TECHNIQUE: Multidetector CT imaging of the abdomen and pelvis was performed using the standard protocol following bolus administration of intravenous contrast. CONTRAST:  143m OMNIPAQUE IOHEXOL 300 MG/ML  SOLN COMPARISON:  CT abdomen and pelvis without and with contrast  05/18/2010 FINDINGS: Lower chest: Lung bases are clear without focal nodule, mass, or airspace disease. The heart size is normal. No significant pleural or pericardial effusion is present. Hepatobiliary: A peripherally calcified gallstone is present at the neck of the gallbladder. Mild inflammatory changes are present about the gallbladder. The common bile duct and gallbladder are normal. Liver is unremarkable. Pancreas: Unremarkable. No pancreatic ductal dilatation or surrounding inflammatory changes. Spleen: Normal in size without focal abnormality. Adrenals/Urinary Tract: Adrenal glands are unremarkable. Kidneys are normal, without renal calculi, focal lesion, or hydronephrosis. Bladder is unremarkable. Stomach/Bowel: Stomach and duodenum are within normal limits. Small bowel is normal. The appendix is visualized and normal. Ascending and transverse colon are  within normal limits. The descending and sigmoid colon are normal. Vascular/Lymphatic: No significant vascular findings are present. No enlarged abdominal or pelvic lymph nodes. Reproductive: Clips are present in the fallopian tubes bilaterally. IUD is in place. There is heterogeneous enlargement of the cervix measuring 3.8 x 6.1 x 4.7 cm. Uterus is otherwise unremarkable. Adnexa are within normal limits. Other: No abdominal wall hernia or abnormality. No abdominopelvic ascites. Musculoskeletal: Slight degenerative retrolisthesis is present at C2-3 and C3-4. Superior endplate Schmorl's nodes are present at L1, L2, and L3. Vertebral body heights are maintained. Alignment is otherwise anatomic. The bony pelvis is within normal limits. Hips are located and within normal limits bilaterally. IMPRESSION: 1. Peripherally calcified gallstone in the neck of the gallbladder measures 1.9 cm. 2. Mild inflammatory changes about the gallbladder raise concern for acute cholecystitis. Right upper quadrant ultrasound may be useful for further evaluation. 3. Enlarged  heterogeneous cervix is concerning for malignancy. Recommend GYN referral. Electronically Signed   By: San Morelle M.D.   On: 07/27/2019 07:27    Past Medical History:  Diagnosis Date  . Anemia   . Prediabetes     Past Surgical History:  Procedure Laterality Date  . BREAST BIOPSY  2000   benign  . CHOLECYSTECTOMY N/A 07/28/2019   Procedure: LAPAROSCOPIC CHOLECYSTECTOMY WITH INTRAOPERATIVE CHOLANGIOGRAM;  Surgeon: Michael Boston, MD;  Location: [facility];  Service: General;  Laterality: N/A;  . TUBAL LIGATION  2004   postpartum    Social History   Socioeconomic History  . Marital status: Single    Spouse name: Not on file  . Number of children: 4  . Years of education: Not on file  . Highest education level: Not on file  Occupational History  . Occupation: works in Programmer, applications and as an Veterinary surgeon at [employer] Use  . Smoking status: Never Smoker  . Smokeless tobacco: Never Used  Substance and Sexual Activity  . Alcohol use: No    Comment: occ wine  . Drug use: No  . Sexual activity: Yes    Partners: Male    Birth control/protection: I.U.D.  Other Topics Concern  . Not on file  Social History Narrative  . Not on file   Social Determinants of Health   Financial Resource Strain:   . Difficulty of Paying Living Expenses: Not on file  Food Insecurity:   . Worried About Charity fundraiser in the Last Year: Not on file  . Ran Out of Food in the Last Year: Not on file  Transportation Needs:   . Lack of Transportation (Medical): Not on file  . Lack of Transportation (Non-Medical): Not on file  Physical Activity:   . Days of Exercise per Week: Not on file  . Minutes of Exercise per Session: Not on file  Stress:   . Feeling of Stress : Not on file  Social Connections:   . Frequency of Communication with Friends and Family: Not on file  . Frequency of Social Gatherings with Friends and Family: Not on file  . Attends Religious Services: Not  on file  . Active Member of Clubs or Organizations: Not on file  . Attends Archivist Meetings: Not on file  . Marital Status: Not on file  Intimate Partner Violence:   . Fear of Current or Ex-Partner: Not on file  . Emotionally Abused: Not on file  . Physically Abused: Not on file  . Sexually Abused: Not on file    Family  History  Problem Relation Age of Onset  . Diabetes Maternal Grandmother   . Congestive Heart Failure Maternal Grandmother   . Depression Mother   . Stroke Mother 58  . Breast cancer Cousin 55       paternal first cousin  . Colon cancer Neg Hx     Current Facility-Administered Medications  Medication Dose Route Frequency Provider Last Rate Last Admin  . 0.9 %  sodium chloride infusion  1,000 mL Intravenous Continuous Michael Boston, MD 125 mL/hr at 07/29/19 0725 1,000 mL at 07/29/19 0725  . 0.9 %  sodium chloride infusion   Intravenous PRN Michael Boston, MD   Stopped at 07/27/19 1044  . 0.9 %  sodium chloride infusion   Intravenous Q8H PRN Michael Boston, MD      . 0.9 %  sodium chloride infusion  250 mL Intravenous PRN Michael Boston, MD      . acetaminophen (TYLENOL) tablet 650 mg  650 mg Oral Q6H PRN Michael Boston, MD   650 mg at 07/29/19 0431   Or  . acetaminophen (TYLENOL) suppository 650 mg  650 mg Rectal Q6H PRN Michael Boston, MD      . alum & mag hydroxide-simeth (MAALOX/MYLANTA) 200-200-20 MG/5ML suspension 30 mL  30 mL Oral Q6H PRN Michael Boston, MD      . bisacodyl (DULCOLAX) suppository 10 mg  10 mg Rectal Q12H PRN Michael Boston, MD      . bupivacaine liposome (EXPAREL) 1.3 % injection 266 mg  20 mL Infiltration Once Michael Boston, MD      . cefTRIAXone (ROCEPHIN) 2 g in sodium chloride 0.9 % 100 mL IVPB  2 g Intravenous Q24H Michael Boston, MD 200 mL/hr at 07/28/19 2015 2 g at 07/28/19 2015  . diphenhydrAMINE (BENADRYL) 12.5 MG/5ML elixir 12.5 mg  12.5 mg Oral Q6H PRN Michael Boston, MD       Or  . diphenhydrAMINE (BENADRYL) injection 12.5 mg   12.5 mg Intravenous Q6H PRN Michael Boston, MD      . enalaprilat (VASOTEC) injection 0.625-1.25 mg  0.625-1.25 mg Intravenous Q6H PRN Michael Boston, MD      . enoxaparin (LOVENOX) injection 40 mg  40 mg Subcutaneous Q24H Michael Boston, MD   40 mg at 07/28/19 2014  . guaiFENesin-dextromethorphan (ROBITUSSIN DM) 100-10 MG/5ML syrup 10 mL  10 mL Oral Q4H PRN Michael Boston, MD      . hydrocortisone (ANUSOL-HC) 2.5 % rectal cream 1 application  1 application Topical QID PRN Michael Boston, MD      . hydrocortisone cream 1 % 1 application  1 application Topical TID PRN Michael Boston, MD      . HYDROmorphone (DILAUDID) injection 0.5-2 mg  0.5-2 mg Intravenous Q2H PRN Michael Boston, MD   1 mg at 07/28/19 1455  . lactated ringers bolus 1,000 mL  1,000 mL Intravenous Q8H PRN Michael Boston, MD      . lactated ringers bolus 1,000 mL  1,000 mL Intravenous Q8H PRN Lilyahna Sirmon, Remo Lipps, MD      . lip balm (CARMEX) ointment 1 application  1 application Topical BID Michael Boston, MD   1 application at 09/32/67 2334  . magic mouthwash  15 mL Oral QID PRN Michael Boston, MD      . menthol-cetylpyridinium (CEPACOL) lozenge 3 mg  1 lozenge Oral PRN Michael Boston, MD      . methocarbamol (ROBAXIN) 1,000 mg in dextrose 5 % 100 mL IVPB  1,000 mg Intravenous Q6H PRN Michael Boston, MD      .  methocarbamol (ROBAXIN) tablet 1,000 mg  1,000 mg Oral Q6H PRN Michael Boston, MD      . metoprolol tartrate (LOPRESSOR) injection 5 mg  5 mg Intravenous Q6H PRN Michael Boston, MD      . ondansetron [facility]) injection 4 mg  4 mg Intravenous Q6H PRN Michael Boston, MD   4 mg at 07/28/19 0232   Or  . ondansetron (ZOFRAN) 8 mg in sodium chloride 0.9 % 50 mL IVPB  8 mg Intravenous Q6H PRN Michael Boston, MD      . ondansetron (ZOFRAN) injection 4 mg  4 mg Intravenous Q6H PRN Michael Boston, MD   4 mg at 07/27/19 1525  . oxyCODONE (Oxy IR/ROXICODONE) immediate release tablet 5-10 mg  5-10 mg Oral Q4H PRN Michael Boston, MD   5 mg at 07/28/19 2023  .  phenol (CHLORASEPTIC) mouth spray 1-2 spray  1-2 spray Mouth/Throat PRN Michael Boston, MD      . prochlorperazine (COMPAZINE) injection 5-10 mg  5-10 mg Intravenous Q4H PRN Michael Boston, MD   10 mg at 07/28/19 1507  . simethicone (MYLICON) chewable tablet 40 mg  40 mg Oral Q6H PRN Michael Boston, MD      . sodium chloride flush (NS) 0.9 % injection 3 mL  3 mL Intravenous Gorden Harms, MD      . sodium chloride flush (NS) 0.9 % injection 3 mL  3 mL Intravenous PRN Michael Boston, MD      . traMADol Veatrice Bourbon) tablet 50-100 mg  50-100 mg Oral Q6H PRN Michael Boston, MD   50 mg at 07/28/19 2338     No Known Allergies  Signed: Morton Peters, MD, FACS, MASCRS Gastrointestinal and Minimally Invasive Surgery  [facility] [address], [place], [place] [address] [phone] Main / Paging [phone] Fax     07/29/2019, 10:23 AM

## 2019-07-29 NOTE — Plan of Care (Signed)
All goals met for d/c 

## 2019-07-29 NOTE — Progress Notes (Signed)
Pt alert, oriented, tolerating diet.  Pt given d/c instructions, d/cd home.

## 2019-07-30 NOTE — Telephone Encounter (Signed)
Left message to call Radhika Dershem at [phone]. 

## 2019-07-30 NOTE — Telephone Encounter (Signed)
Spoke with patient. Results given. Patient verbalizes understanding. Patient will keep appointment for pap smear on 08/07/2019 at 1 pm with Dr.Jertson. Encounter closed.

## 2019-07-31 ENCOUNTER — Encounter: Payer: Self-pay | Admitting: *Deleted

## 2019-07-31 ENCOUNTER — Other Ambulatory Visit: Payer: Self-pay | Admitting: *Deleted

## 2019-07-31 LAB — SURGICAL PATHOLOGY

## 2019-07-31 NOTE — Patient Outreach (Signed)
[facility]) Care Management  07/31/2019  Linda Shepherd [birth year] [id number]   Transition of care call/case closure   Referral received:07/30/19 Initial outreach: 07/31/19 Insurance: [employer]    Subjective: Initial successful telephone call to patient's preferred number in order to complete transition of care assessment; 2 HIPAA identifiers verified. Explained purpose of call and completed transition of care assessment.  States she feeling better  denies post-operative problems, says surgical area is unremarkable, dressing in place to be removed on tomorrow, states surgical pain well managed with prescribed medications. She reports tolerating bland diet , reports one episode of nausea on yesterday relieved with prn zofran. She  denies bowel or bladder problems, reporting having bowel movement since home  She reports tolerating mobility in the home.  Her son is assisting with her  recovery.  She discussed having prediabetes and has attended classes at nutrition center for concerns of prediabetes and obesity she is interested in a  referral to one of the [place] chronic disease management programs. 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  She is unsure if she has the hospital indemnity, she has not yet contact benefits for FMLA as she states she will be out of work at least 2 weeks.  She does not  Use a [facility] outpatient pharmacy.  She  denies educational needs related to staying safe during the COVID 19 pandemic.    Objective:  Linda Shepherd, was hospitalized at [facility] from 12/18-12/2 for Cholecystitis, Laparoscopic cholecystectomy   Comorbidities include: PreDiabetes,Obesity ,  Abnormality of cervix ( has follow up gynecologist scheduled )  She was discharged to home on 07/29/19  without the need for home health services or DME.   Assessment:  Patient voices good understanding of all discharge  instructions.  See transition of care flowsheet for assessment details.   Plan:  Reviewed hospital discharge diagnosis of Laparoscopic Cholecystectomy    and discharge treatment plan using hospital discharge instructions, assessing medication adherence, reviewing problems requiring provider notification, and discussing the importance of follow up with surgeon, primary care provider and/or specialists as directed. Reviewed [place] healthy lifestyle program information to keep premiums low for 2022:  Step 1: Get annual physical between August 09, 2018 and February 07, 2020; Step 2: Complete your health assessment between August 10, 2019 and April 09, 2020 at [email or web address] Step 3:Identify your current health status and complete the corresponding action step between January 1, and April 09, 2020.   Using [place] website, placed referral for chronic condition management as patient agreed to referral, provided Active health management contact number at [phone]. Patient was provided contact number for [place] benefits and [facility] indemnity contact number. She was also provided contact number for [facility] weight and wellness clinic.   No ongoing care management needs identified so will close case to [place] Management services and route successful outreach letter with [place] Management pamphlet and 24 Hour Nurse Line Magnet to [place] Management clinical pool to be mailed to patient's home address. Thanked patient for their services to [facility].   Linda Draft, RN, [place] Management Coordinator  [phone]- Mobile [phone]- Toll Free Main Office

## 2019-08-06 ENCOUNTER — Other Ambulatory Visit: Payer: Self-pay

## 2019-08-06 NOTE — Progress Notes (Signed)
GYNECOLOGY  VISIT   HPI: 43 y.o.   Single White or Caucasian Hispanic or Latino  female   G1P4 with Patient's last menstrual period was 07/08/2019 (exact date). here for pap   The patient had a CT earlier this month that showed an enlarged heterogeneous cervix concerning for malignancy, pap was recommended.  Her last pap was in 8/17, it was negative with negative HPV. Prior pap as in 1/14 negative, negative hpv. She had one abnormal pap years ago, f/u was normal.   GYNECOLOGIC HISTORY: Patient's last menstrual period was 07/08/2019 (exact date). Contraception: IUD Menopausal hormone therapy: None        OB History    Gravida  4   Para  4   Term      Preterm      AB      Living  4     SAB      TAB      Ectopic      Multiple      Live Births                 Patient Active Problem List   Diagnosis Date Noted  . Acute calculous cholecystitis 07/27/2019  . BMI 45.0-49.9, adult ([place]) 01/16/2019  . Prediabetes   . Plantar fasciitis of left foot 08/26/2017  . Pes planus, flexible 08/26/2017  . IUD (intrauterine device) in place 11/14/2013    Past Medical History:  Diagnosis Date  . Anemia   . Prediabetes     Past Surgical History:  Procedure Laterality Date  . BREAST BIOPSY  2000   benign  . CHOLECYSTECTOMY N/A 07/28/2019   Procedure: LAPAROSCOPIC CHOLECYSTECTOMY WITH INTRAOPERATIVE CHOLANGIOGRAM;  Surgeon: Michael Boston, MD;  Location: [facility];  Service: General;  Laterality: N/A;  . TUBAL LIGATION  2004   postpartum    Current Outpatient Medications  Medication Sig Dispense Refill  . Cholecalciferol (VITAMIN D) 125 MCG (5000 UT) CAPS Take 5,000 Units by mouth daily.    Marland Kitchen levonorgestrel (MIRENA) 20 MCG/24HR IUD 1 Intra Uterine Device (1 each total) by Intrauterine route once. 1 Intra Uterine Device 0  . methocarbamol (ROBAXIN) 750 MG tablet Take 1 tablet (750 mg total) by mouth 4 (four) times daily as needed (use for muscle cramps/pain). 10 tablet 1   . ondansetron (ZOFRAN) 4 MG tablet Take 1 tablet (4 mg total) by mouth every 8 (eight) hours as needed for nausea. (Patient not taking: Reported on 08/07/2019) 5 tablet 2  . traMADol (ULTRAM) 50 MG tablet Take 1-2 tablets (50-100 mg total) by mouth every 6 (six) hours as needed for moderate pain or severe pain. (Patient not taking: Reported on 08/07/2019) 30 tablet 0   No current facility-administered medications for this visit.     ALLERGIES: Patient has no known allergies.  Family History  Problem Relation Age of Onset  . Diabetes Maternal Grandmother   . Congestive Heart Failure Maternal Grandmother   . Depression Mother   . Stroke Mother 50  . Breast cancer Cousin 76       paternal first cousin  . Colon cancer Neg Hx     Social History   Socioeconomic History  . Marital status: Single    Spouse name: Not on file  . Number of children: 4  . Years of education: Not on file  . Highest education level: Not on file  Occupational History  . Occupation: works in Programmer, applications and as an Veterinary surgeon at [employer]  Tobacco Use  . Smoking status: Never Smoker  . Smokeless tobacco: Never Used  Substance and Sexual Activity  . Alcohol use: No    Comment: occ wine  . Drug use: No  . Sexual activity: Yes    Partners: Male    Birth control/protection: I.U.D.  Other Topics Concern  . Not on file  Social History Narrative  . Not on file   Social Determinants of Health   Financial Resource Strain:   . Difficulty of Paying Living Expenses: Not on file  Food Insecurity:   . Worried About Charity fundraiser in the Last Year: Not on file  . Ran Out of Food in the Last Year: Not on file  Transportation Needs:   . Lack of Transportation (Medical): Not on file  . Lack of Transportation (Non-Medical): Not on file  Physical Activity:   . Days of Exercise per Week: Not on file  . Minutes of Exercise per Session: Not on file  Stress:   . Feeling of Stress : Not on file   Social Connections:   . Frequency of Communication with Friends and Family: Not on file  . Frequency of Social Gatherings with Friends and Family: Not on file  . Attends Religious Services: Not on file  . Active Member of Clubs or Organizations: Not on file  . Attends Archivist Meetings: Not on file  . Marital Status: Not on file  Intimate Partner Violence:   . Fear of Current or Ex-Partner: Not on file  . Emotionally Abused: Not on file  . Physically Abused: Not on file  . Sexually Abused: Not on file    Review of Systems  Constitutional: Negative.   HENT: Negative.   Eyes: Negative.   Respiratory: Negative.   Cardiovascular: Negative.   Gastrointestinal: Negative.   Genitourinary: Negative.   Musculoskeletal: Negative.   Skin: Negative.   Neurological: Negative.   Endo/Heme/Allergies: Negative.   Psychiatric/Behavioral: Negative.     PHYSICAL EXAMINATION:    BP 112/82 (BP Location: Right Arm, Patient Position: Sitting, Cuff Size: Large)   Pulse 88   Temp 98.7 F (37.1 C) (Skin)   Wt 256 lb 12.8 oz (116.5 kg)   LMP 07/08/2019 (Exact Date)   BMI 45.49 kg/m     General appearance: alert, cooperative and appears stated age   Pelvic: External genitalia:  no lesions              Urethra:  normal appearing urethra with no masses, tenderness or lesions              Bartholins and Skenes: normal                 Vagina: normal appearing vagina with normal color and discharge, no lesions              Cervix: no lesions and not enlarged or firm, IUD string 1-2 cm              Bimanual Exam:  Uterus:  normal size, contour, position, consistency, mobility, non-tender              Adnexa: no mass, fullness, tenderness                Chaperone was present for exam.  ASSESSMENT Patient with abnormal cervical appearance on CT No h/o cervical dysplasia, last normal pap with negative hpv was in 8/17    PLAN Pap with hpv done Further plans depending on the  results   An After Visit Summary was printed and given to the patient.

## 2019-08-07 ENCOUNTER — Encounter: Payer: Self-pay | Admitting: Obstetrics and Gynecology

## 2019-08-07 ENCOUNTER — Ambulatory Visit (INDEPENDENT_AMBULATORY_CARE_PROVIDER_SITE_OTHER): Payer: 59 | Admitting: Obstetrics and Gynecology

## 2019-08-07 VITALS — BP 112/82 | HR 88 | Temp 98.7°F | Wt 256.8 lb

## 2019-08-07 DIAGNOSIS — Z124 Encounter for screening for malignant neoplasm of cervix: Secondary | ICD-10-CM | POA: Diagnosis not present

## 2019-08-07 DIAGNOSIS — R935 Abnormal findings on diagnostic imaging of other abdominal regions, including retroperitoneum: Secondary | ICD-10-CM | POA: Diagnosis not present

## 2019-08-08 ENCOUNTER — Encounter: Payer: Self-pay | Admitting: Obstetrics and Gynecology

## 2019-08-08 ENCOUNTER — Other Ambulatory Visit (HOSPITAL_COMMUNITY)
Admission: RE | Admit: 2019-08-08 | Discharge: 2019-08-08 | Disposition: A | Discharge status: Home / Self Care | Payer: 59 | Source: Ambulatory Visit | Attending: Obstetrics and Gynecology | Admitting: Obstetrics and Gynecology

## 2019-08-08 DIAGNOSIS — R87612 Low grade squamous intraepithelial lesion on cytologic smear of cervix (LGSIL): Secondary | ICD-10-CM | POA: Diagnosis not present

## 2019-08-08 DIAGNOSIS — Z124 Encounter for screening for malignant neoplasm of cervix: Secondary | ICD-10-CM | POA: Insufficient documentation

## 2019-08-08 DIAGNOSIS — R935 Abnormal findings on diagnostic imaging of other abdominal regions, including retroperitoneum: Secondary | ICD-10-CM | POA: Insufficient documentation

## 2019-08-13 ENCOUNTER — Telehealth: Payer: Self-pay | Admitting: *Deleted

## 2019-08-13 DIAGNOSIS — R87612 Low grade squamous intraepithelial lesion on cytologic smear of cervix (LGSIL): Secondary | ICD-10-CM

## 2019-08-13 DIAGNOSIS — R935 Abnormal findings on diagnostic imaging of other abdominal regions, including retroperitoneum: Secondary | ICD-10-CM

## 2019-08-13 LAB — CYTOLOGY - PAP
Comment: NEGATIVE
High risk HPV: NEGATIVE

## 2019-08-13 NOTE — Telephone Encounter (Signed)
-----   Message from Salvadore Dom, MD sent at 08/13/2019  3:45 PM EST ----- Please let the patient know that her pap returned with LSIL, negative hpv. If it weren't for her CT I would recommend f/u pap in a year, but with her abnormal CT, I would recommend she return for a colposcopy.

## 2019-08-13 NOTE — Telephone Encounter (Signed)
Burnice Logan, RN  08/13/2019 3:51 PM EST    Left message to call Sharee Pimple, RN at [facility].

## 2019-08-14 NOTE — Telephone Encounter (Signed)
Left message to call Arrick Dutton, RN at [facility] [phone].   

## 2019-08-14 NOTE — Telephone Encounter (Signed)
Patient is returning a call to Jill. °

## 2019-08-14 NOTE — Telephone Encounter (Signed)
Spoke with patient. Advised of results as seen below per Dr. Talbert Nan. Brief explanation of colpo provided. Patient agreeable to proceed with colpo. IUD for contraceptive. LMP 08/11/19. Colpo scheduled for 08/16/19 at 3pm. Advised to take Motrin 800 mg with food and water one hour before procedure. Order placed for precert. Patient verbalizes understanding and is agreeable.  [id number] prescreen negative, precautions reviewed.   Routing to business office for precert.

## 2019-08-15 NOTE — Telephone Encounter (Signed)
Call placed to convey benefits for colposcopy. Spoke with the patient and conveyed the benefits. Patient understands/agreeable with the benefits. Patient is aware of the cancellation policy. Appointment scheduled 08/16/19.

## 2019-08-16 ENCOUNTER — Ambulatory Visit (INDEPENDENT_AMBULATORY_CARE_PROVIDER_SITE_OTHER): Payer: 59 | Admitting: Obstetrics and Gynecology

## 2019-08-16 ENCOUNTER — Encounter: Payer: Self-pay | Admitting: Obstetrics and Gynecology

## 2019-08-16 ENCOUNTER — Other Ambulatory Visit (HOSPITAL_COMMUNITY)
Admission: RE | Admit: 2019-08-16 | Discharge: 2019-08-16 | Disposition: A | Discharge status: Home / Self Care | Payer: 59 | Source: Ambulatory Visit | Attending: Obstetrics and Gynecology | Admitting: Obstetrics and Gynecology

## 2019-08-16 ENCOUNTER — Other Ambulatory Visit: Payer: Self-pay

## 2019-08-16 VITALS — BP 111/64 | HR 76 | Temp 98.2°F | Ht 63.0 in | Wt 257.0 lb

## 2019-08-16 DIAGNOSIS — N870 Mild cervical dysplasia: Secondary | ICD-10-CM | POA: Diagnosis not present

## 2019-08-16 DIAGNOSIS — R87612 Low grade squamous intraepithelial lesion on cytologic smear of cervix (LGSIL): Secondary | ICD-10-CM | POA: Insufficient documentation

## 2019-08-16 DIAGNOSIS — R935 Abnormal findings on diagnostic imaging of other abdominal regions, including retroperitoneum: Secondary | ICD-10-CM | POA: Insufficient documentation

## 2019-08-16 NOTE — Patient Instructions (Signed)

## 2019-08-16 NOTE — Progress Notes (Signed)
GYNECOLOGY  VISIT   HPI: 44 y.o.   Single White or Caucasian Hispanic or Latino  female   G26P4 with Patient's last menstrual period was 08/12/2019.   here for   Colposcopy. The patient was noted to have an enlarged herterogenous cervix concerning for malignancy on CT scan last month.  Pap smear returned with LSIL, negative HPV  GYNECOLOGIC HISTORY: Patient's last menstrual period was 08/12/2019. Contraception: tubal ligation  Menopausal hormone therapy: no        OB History    Gravida  4   Para  4   Term      Preterm      AB      Living  4     SAB      TAB      Ectopic      Multiple      Live Births                 Patient Active Problem List   Diagnosis Date Noted  . Acute calculous cholecystitis 07/27/2019  . BMI 45.0-49.9, adult ([place]) 01/16/2019  . Prediabetes   . Plantar fasciitis of left foot 08/26/2017  . Pes planus, flexible 08/26/2017  . IUD (intrauterine device) in place 11/14/2013    Past Medical History:  Diagnosis Date  . Anemia   . Prediabetes     Past Surgical History:  Procedure Laterality Date  . BREAST BIOPSY  2000   benign  . CHOLECYSTECTOMY N/A 07/28/2019   Procedure: LAPAROSCOPIC CHOLECYSTECTOMY WITH INTRAOPERATIVE CHOLANGIOGRAM;  Surgeon: Michael Boston, MD;  [facility];  Service: General;  Laterality: N/A;  . TUBAL LIGATION  2004   postpartum    Current Outpatient Medications  Medication Sig Dispense Refill  . Cholecalciferol (VITAMIN D) 125 MCG (5000 UT) CAPS Take 5,000 Units by mouth daily.    Marland Kitchen levonorgestrel (MIRENA) 20 MCG/24HR IUD 1 Intra Uterine Device (1 each total) by Intrauterine route once. 1 Intra Uterine Device 0   No current facility-administered medications for this visit.     ALLERGIES: Patient has no known allergies.  Family History  Problem Relation Age of Onset  . Diabetes Maternal Grandmother   . Congestive Heart Failure Maternal Grandmother   . Depression Mother   . Stroke Mother 88  .  Breast cancer Cousin 23       paternal first cousin  . Colon cancer Neg Hx     Social History   Socioeconomic History  . Marital status: Single    Spouse name: Not on file  . Number of children: 4  . Years of education: Not on file  . Highest education level: Not on file  Occupational History  . Occupation: works in Programmer, applications and as an Veterinary surgeon at [employer]  . Smoking status: Never Smoker  . Smokeless tobacco: Never Used  Substance and Sexual Activity  . Alcohol use: No    Comment: occ wine  . Drug use: No  . Sexual activity: Yes    Partners: Male    Birth control/protection: I.U.D.  Other Topics Concern  . Not on file  Social History Narrative  . Not on file   Social Determinants of Health   Financial Resource Strain:   . Difficulty of Paying Living Expenses: Not on file  Food Insecurity:   . Worried About Charity fundraiser in the Last Year: Not on file  . Ran Out of Food in the Last Year: Not  on file  Transportation Needs:   . Lack of Transportation (Medical): Not on file  . Lack of Transportation (Non-Medical): Not on file  Physical Activity:   . Days of Exercise per Week: Not on file  . Minutes of Exercise per Session: Not on file  Stress:   . Feeling of Stress : Not on file  Social Connections:   . Frequency of Communication with Friends and Family: Not on file  . Frequency of Social Gatherings with Friends and Family: Not on file  . Attends Religious Services: Not on file  . Active Member of Clubs or Organizations: Not on file  . Attends Archivist Meetings: Not on file  . Marital Status: Not on file  Intimate Partner Violence:   . Fear of Current or Ex-Partner: Not on file  . Emotionally Abused: Not on file  . Physically Abused: Not on file  . Sexually Abused: Not on file    Review of Systems  All other systems reviewed and are negative.   PHYSICAL EXAMINATION:    BP 111/64   Pulse 76   Temp 98.2 F  (36.8 C)   Ht 5' 3"  (1.6 m)   Wt 257 lb (116.6 kg)   LMP 08/12/2019   SpO2 96%   BMI 45.53 kg/m     General appearance: alert, cooperative and appears stated age  Pelvic: External genitalia:  no lesions              Urethra:  normal appearing urethra with no masses, tenderness or lesions              Bartholins and Skenes: normal                 Vagina: normal appearing vagina with normal color and discharge, no lesions              Cervix: no gross lesions. IUD strings 3 cm  Colposcopy: unsatisfactory, minimal aceto-white changes posteriorly outside of the transformation zone, biopsy at 6 o'clock. ECC done. Biopsy site treated with silver nitrate, negative lugols examination of the upper vagina.   Chaperone was present for exam.  ASSESSMENT Abnormal CT of cervix, concerning for malignancy Pap with LSIL, negative HR HPV    PLAN Colposcopy with biopsy and ECC done Further plans after results return.  Will discuss with Gyn Oncology after results are back   An After Visit Summary was printed and given to the patient.

## 2019-08-17 DIAGNOSIS — Z23 Encounter for immunization: Secondary | ICD-10-CM | POA: Diagnosis not present

## 2019-08-20 LAB — SURGICAL PATHOLOGY

## 2019-08-22 ENCOUNTER — Telehealth: Payer: Self-pay | Admitting: Obstetrics and Gynecology

## 2019-08-22 DIAGNOSIS — R935 Abnormal findings on diagnostic imaging of other abdominal regions, including retroperitoneum: Secondary | ICD-10-CM

## 2019-08-22 NOTE — Telephone Encounter (Signed)
Please let the patient know that her pathology returned with CIN 1, ECC with atypia c/w HPV effect.  I reached out to the GYN Oncologist (Dr Denman George), she recommended we have her come in for a pelvic ultrasound. The ultrasound visualizes the cervix better than CT. Please schedule the ultrasound

## 2019-08-22 NOTE — Telephone Encounter (Signed)
Spoke with patient, advised per Dr. Talbert Nan. PUS scheduled for 08/28/19 at 3:30pm, consult to follow with Dr. Talbert Nan. Order placed for precert. Patient verbalizes understanding and is agreeable.   Routing to provider for final review. Patient is agreeable to disposition. Will close encounter.  Cc: Lerry Liner, Magdalene Patricia

## 2019-08-28 ENCOUNTER — Other Ambulatory Visit: Payer: Self-pay

## 2019-08-28 ENCOUNTER — Encounter: Payer: Self-pay | Admitting: Obstetrics and Gynecology

## 2019-08-28 ENCOUNTER — Ambulatory Visit: Payer: 59 | Admitting: Obstetrics and Gynecology

## 2019-08-28 ENCOUNTER — Ambulatory Visit (INDEPENDENT_AMBULATORY_CARE_PROVIDER_SITE_OTHER): Payer: 59

## 2019-08-28 VITALS — BP 135/80 | HR 78 | Temp 97.9°F | Ht 63.0 in | Wt 257.0 lb

## 2019-08-28 DIAGNOSIS — N870 Mild cervical dysplasia: Secondary | ICD-10-CM | POA: Diagnosis not present

## 2019-08-28 DIAGNOSIS — N888 Other specified noninflammatory disorders of cervix uteri: Secondary | ICD-10-CM

## 2019-08-28 DIAGNOSIS — R935 Abnormal findings on diagnostic imaging of other abdominal regions, including retroperitoneum: Secondary | ICD-10-CM | POA: Diagnosis not present

## 2019-08-28 NOTE — Progress Notes (Signed)
GYNECOLOGY  VISIT   HPI: 44 y.o.   Single White or Caucasian Hispanic or Latino  female   G56P4 with Patient's last menstrual period was 08/12/2019.   here for ultrasound to evaluate her cervix. CT from 07/27/19 showed an enlarged heterogeneous cervix concerning for malignancy. Pap was done 08/07/19 returned with  LSIL, negative hpv. Colposcopy was done on 08/15/18 biopsy with CIN I, ECC with koilocytic atypia.   GYNECOLOGIC HISTORY: Patient's last menstrual period was 08/12/2019. Contraception:IUD Menopausal hormone therapy: none        OB History    Gravida  4   Para  4   Term      Preterm      AB      Living  4     SAB      TAB      Ectopic      Multiple      Live Births                 Patient Active Problem List   Diagnosis Date Noted  . Acute calculous cholecystitis 07/27/2019  . BMI 45.0-49.9, adult ([place]) 01/16/2019  . Prediabetes   . Plantar fasciitis of left foot 08/26/2017  . Pes planus, flexible 08/26/2017  . IUD (intrauterine device) in place 11/14/2013    Past Medical History:  Diagnosis Date  . Anemia   . Prediabetes     Past Surgical History:  Procedure Laterality Date  . BREAST BIOPSY  2000   benign  . CHOLECYSTECTOMY N/A 07/28/2019   Procedure: LAPAROSCOPIC CHOLECYSTECTOMY WITH INTRAOPERATIVE CHOLANGIOGRAM;  Surgeon: Michael Boston, MD;  Location: [facility];  Service: General;  Laterality: N/A;  . TUBAL LIGATION  2004   postpartum    Current Outpatient Medications  Medication Sig Dispense Refill  . Cholecalciferol (VITAMIN D) 125 MCG (5000 UT) CAPS Take 5,000 Units by mouth daily.    Marland Kitchen levonorgestrel (MIRENA) 20 MCG/24HR IUD 1 Intra Uterine Device (1 each total) by Intrauterine route once. 1 Intra Uterine Device 0   No current facility-administered medications for this visit.     ALLERGIES: Patient has no known allergies.  Family History  Problem Relation Age of Onset  . Diabetes Maternal Grandmother   . Congestive Heart  Failure Maternal Grandmother   . Depression Mother   . Stroke Mother 41  . Breast cancer Cousin 46       paternal first cousin  . Colon cancer Neg Hx     Social History   Socioeconomic History  . Marital status: Single    Spouse name: Not on file  . Number of children: 4  . Years of education: Not on file  . Highest education level: Not on file  Occupational History  . 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 Smoking status: Never Smoker  . Smokeless tobacco: Never Used  Substance and Sexual Activity  . Alcohol use: No    Comment: occ wine  . Drug use: No  . Sexual activity: Yes    Partners: Male    Birth control/protection: I.U.D.  Other Topics Concern  . Not on file  Social History Narrative  . Not on file   Social Determinants of Health   Financial Resource Strain:   . Difficulty of Paying Living Expenses: Not on file  Food Insecurity:   . Worried About Charity fundraiser in the Last Year: Not on file  . Ran  Out of Food in the Last Year: Not on file  Transportation Needs:   . Lack of Transportation (Medical): Not on file  . Lack of Transportation (Non-Medical): Not on file  Physical Activity:   . Days of Exercise per Week: Not on file  . Minutes of Exercise per Session: Not on file  Stress:   . Feeling of Stress : Not on file  Social Connections:   . Frequency of Communication with Friends and Family: Not on file  . Frequency of Social Gatherings with Friends and Family: Not on file  . Attends Religious Services: Not on file  . Active Member of Clubs or Organizations: Not on file  . Attends Archivist Meetings: Not on file  . Marital Status: Not on file  Intimate Partner Violence:   . Fear of Current or Ex-Partner: Not on file  . Emotionally Abused: Not on file  . Physically Abused: Not on file  . Sexually Abused: Not on file    ROS  PHYSICAL EXAMINATION:    BP 135/80 (BP Location:  Right Arm, Patient Position: Sitting, Cuff Size: Large)   Pulse 78   Temp 97.9 F (36.6 C) (Temporal)   Ht 5' 3"  (1.6 m)   Wt 257 lb (116.6 kg)   LMP 08/12/2019   BMI 45.53 kg/m     General appearance: alert, cooperative and appears stated age  Ultrasound images reviewed with the patient, her cervix is filled with nabothian cysts (at least 20). No solid masses, no atypical vascular flow of the cervix. IUD in place. 3.3 cm simple right ovarian cyst.    ASSESSMENT Abnormal CT of cervix, ultrasound with numerous nabothian cysts in the cervix, no abnormal findings.  LSIL pap in 12/20, CIN I on biopsy     PLAN F/U pap at her annual in 10/21    An After Visit Summary was printed and given to the patient.  ~10 minutes spent in patient care.

## 2019-08-30 ENCOUNTER — Other Ambulatory Visit: Payer: 59

## 2019-08-31 ENCOUNTER — Ambulatory Visit (HOSPITAL_COMMUNITY)
Admission: RE | Admit: 2019-08-31 | Discharge: 2019-08-31 | Disposition: A | Discharge status: Home / Self Care | Payer: 59 | Source: Ambulatory Visit | Attending: Physician Assistant | Admitting: Physician Assistant

## 2019-08-31 ENCOUNTER — Other Ambulatory Visit: Payer: Self-pay

## 2019-08-31 DIAGNOSIS — M25512 Pain in left shoulder: Secondary | ICD-10-CM | POA: Insufficient documentation

## 2019-08-31 DIAGNOSIS — M19012 Primary osteoarthritis, left shoulder: Secondary | ICD-10-CM | POA: Diagnosis not present

## 2019-09-04 DIAGNOSIS — M25512 Pain in left shoulder: Secondary | ICD-10-CM | POA: Diagnosis not present

## 2019-09-14 DIAGNOSIS — Z23 Encounter for immunization: Secondary | ICD-10-CM | POA: Diagnosis not present

## 2019-11-15 DIAGNOSIS — Z7689 Persons encountering health services in other specified circumstances: Secondary | ICD-10-CM | POA: Diagnosis not present

## 2019-11-15 DIAGNOSIS — L659 Nonscarring hair loss, unspecified: Secondary | ICD-10-CM | POA: Diagnosis not present

## 2019-11-15 DIAGNOSIS — Z6841 Body Mass Index (BMI) 40.0 and over, adult: Secondary | ICD-10-CM | POA: Diagnosis not present

## 2019-11-15 DIAGNOSIS — Z8639 Personal history of other endocrine, nutritional and metabolic disease: Secondary | ICD-10-CM | POA: Diagnosis not present

## 2019-11-15 DIAGNOSIS — B360 Pityriasis versicolor: Secondary | ICD-10-CM | POA: Diagnosis not present

## 2019-12-24 DIAGNOSIS — L259 Unspecified contact dermatitis, unspecified cause: Secondary | ICD-10-CM | POA: Diagnosis not present

## 2020-01-05 DIAGNOSIS — J309 Allergic rhinitis, unspecified: Secondary | ICD-10-CM | POA: Diagnosis not present

## 2020-01-05 DIAGNOSIS — J0101 Acute recurrent maxillary sinusitis: Secondary | ICD-10-CM | POA: Diagnosis not present

## 2020-01-11 ENCOUNTER — Telehealth: Payer: Self-pay | Admitting: Physician Assistant

## 2020-01-11 NOTE — Telephone Encounter (Signed)
Patient says that she lost card of provider that she was supposed to see to schedule with for the removal of the lesion near her eye.  Linda Shepherd would like the name , phone number, and address of that provider.  Chart [phone]

## 2020-01-21 DIAGNOSIS — Z8639 Personal history of other endocrine, nutritional and metabolic disease: Secondary | ICD-10-CM | POA: Diagnosis not present

## 2020-01-21 DIAGNOSIS — Z13220 Encounter for screening for lipoid disorders: Secondary | ICD-10-CM | POA: Diagnosis not present

## 2020-01-21 DIAGNOSIS — D519 Vitamin B12 deficiency anemia, unspecified: Secondary | ICD-10-CM | POA: Diagnosis not present

## 2020-01-21 DIAGNOSIS — D529 Folate deficiency anemia, unspecified: Secondary | ICD-10-CM | POA: Diagnosis not present

## 2020-01-21 DIAGNOSIS — D649 Anemia, unspecified: Secondary | ICD-10-CM | POA: Diagnosis not present

## 2020-01-23 DIAGNOSIS — Z0000 Encounter for general adult medical examination without abnormal findings: Secondary | ICD-10-CM | POA: Diagnosis not present

## 2020-01-23 DIAGNOSIS — Z8639 Personal history of other endocrine, nutritional and metabolic disease: Secondary | ICD-10-CM | POA: Diagnosis not present

## 2020-01-23 DIAGNOSIS — R7303 Prediabetes: Secondary | ICD-10-CM | POA: Diagnosis not present

## 2020-02-01 ENCOUNTER — Telehealth: Payer: Self-pay

## 2020-02-01 NOTE — Telephone Encounter (Signed)
Patient is calling in regards to irregular bleeding after biopsy. Patient states she has an IUD and bleeding has begun to get heavy.

## 2020-02-01 NOTE — Telephone Encounter (Signed)
If she is having a change in her bleeding, she may want to have her thyroid checked. I don't think she need to change her IUD prior to 6 years, but she can if she feels her cycles are getting bothersome. I can't promise her that changing the IUD early will help the bleeding.

## 2020-02-01 NOTE — Telephone Encounter (Signed)
Spoke with pt. Pt given recommendations and advice per Dr Talbert Nan. Pt agreeable. Pt to send recent labs from PCP through [facility] for Dr Talbert Nan to review. Pt states had TSH and Vit D level checked at PCP last week and all were good. Pt would like Dr Talbert Nan to review labs and make other recommendations at this time. Declines making OV at this time.   Routing to Dr Talbert Nan.

## 2020-02-01 NOTE — Telephone Encounter (Signed)
Spoke with patient. Patient reports flow with menses has been increasing since 08/2019. Reports headache and cramps start a couple of days prior to menses.  Hx of BTL, Mirena IUD placed 06/2015. Was seen in office for PUS 08/28/19.   LMP 01/31/20. Changed a thin pad q4 hours yesterday, flow has lessened today, wearing a panty liner.  Cramping 2/10. Reports weakness and fatigue, reports that she has also started a second job and is working more. Denies SHOB, lightheadedness, dizziness.   Offered OV on 03/07/20 to further discuss menses, patient declined. Patient request Dr. Talbert Nan review her history and advise if OV needed? Is Mirena IUD recommended for removal at 5 years or 6 years given changes in menses? Advised I will forward for Dr. Talbert Nan to review and f/u. Patient agreeable.   Routing to Dr. Talbert Nan to review.

## 2020-02-18 DIAGNOSIS — R350 Frequency of micturition: Secondary | ICD-10-CM | POA: Diagnosis not present

## 2020-02-18 DIAGNOSIS — K921 Melena: Secondary | ICD-10-CM | POA: Diagnosis not present

## 2020-02-18 DIAGNOSIS — M545 Low back pain: Secondary | ICD-10-CM | POA: Diagnosis not present

## 2020-03-11 NOTE — Telephone Encounter (Signed)
Did we receive records from PCP for review.   Routing to Dr Talbert Nan, please advise

## 2020-03-12 NOTE — Telephone Encounter (Signed)
Left message for pt to return call to triage RN. 

## 2020-03-12 NOTE — Telephone Encounter (Signed)
I don't have her labs? Can you please check. I'm happy to look at her labs, but if she is having bleeding concerns she needs to be seen.

## 2020-03-13 NOTE — Telephone Encounter (Signed)
Spoke with Linda Shepherd. Linda Shepherd given update per Dr Talbert Nan. Linda Shepherd states will call PCP today and have labs faxed to our office.  Linda Shepherd states stopped bleeding on 02/03/20 and has not bled since. Linda Shepherd doesn't know if Covid vaccine series that she received in Feb 2021 had anything to do with her having post shedding issues?  Linda Shepherd advised to have OV for follow up for irregular bleeding. Linda Shepherd declines at this time.  Linda Shepherd advised to calendar bleeding from now until AEX. Linda Shepherd has AEX scheduled in 05/2020 and will keep this appt to discuss AUB. Linda Shepherd agreeable to plan of care. Linda Shepherd advised to be seen in ER or call office with any heavy bleeding or clots. Linda Shepherd verbalized understanding.  Encounter closed.

## 2020-04-06 ENCOUNTER — Encounter: Payer: Self-pay | Admitting: Obstetrics and Gynecology

## 2020-04-07 ENCOUNTER — Telehealth: Payer: Self-pay | Admitting: Obstetrics and Gynecology

## 2020-04-07 NOTE — Telephone Encounter (Signed)
Spoke with patient. Patient reports spotting after intercourse for 4-5 days. Notices this occurs after certain positions. Denies heavy bleeding, pain or any other GYN symptoms. Patient previously reported irregular bleeding, see 02/01/20 telephone encounter.  Recommended OV for further evaluation with provider, patient agreeable. Patient request OV on 04/09/20.   Last PUS 08/28/19, this was in f/u to abnormal CT of cervix, ultrasound with numerous nabothian cysts in the cervix, no abnormal findings.   OV scheduled for 04/09/20 at 9am with Dr. Talbert Nan.  Patient is agreeable to date and time.   Routing to provider for final review. Patient is agreeable to disposition. Will close encounter.

## 2020-04-07 NOTE — Telephone Encounter (Signed)
Linda Shepherd  [facility] Is it possible to have your nurse call me ? This is in reference to our last conversation. That k you!!

## 2020-04-08 DIAGNOSIS — Z111 Encounter for screening for respiratory tuberculosis: Secondary | ICD-10-CM | POA: Diagnosis not present

## 2020-04-09 ENCOUNTER — Other Ambulatory Visit: Payer: Self-pay

## 2020-04-09 ENCOUNTER — Ambulatory Visit: Payer: 59 | Admitting: Obstetrics and Gynecology

## 2020-04-09 ENCOUNTER — Encounter: Payer: Self-pay | Admitting: Obstetrics and Gynecology

## 2020-04-09 ENCOUNTER — Telehealth: Payer: Self-pay | Admitting: Obstetrics and Gynecology

## 2020-04-09 ENCOUNTER — Ambulatory Visit: Payer: Self-pay | Admitting: Obstetrics and Gynecology

## 2020-04-09 VITALS — BP 118/75 | HR 92 | Ht 63.0 in | Wt 240.0 lb

## 2020-04-09 DIAGNOSIS — N930 Postcoital and contact bleeding: Secondary | ICD-10-CM | POA: Diagnosis not present

## 2020-04-09 NOTE — Progress Notes (Signed)
GYNECOLOGY  VISIT   HPI: 44 y.o.   Single White or Caucasian Hispanic or Latino  female   G6P4 with No LMP recorded. (Menstrual status: IUD).   here for evaluation of post coital bleeding. She had a CT in 12/20 that incidentally showed an enlarged heterogeneous cervix concerning for malignancy. Pap in 12/20 with LSIL, negative HPV. Colposcopy in 1/21 with CIN I, ECC with koilocytic atypia. An ultrasound was then done in 1/21 that showed that the cervix was filled with nabothian cyst (over 20), no concerning finding.   She has a mirena IUD (placed in 11/16) that was noted to be in place during the recent ultrasound. With the IUD she wasn't having regular cycles until, January. Since January she started having monthly cycles. For the last 4 months she has been having intermittent bleeding after intercourse, only in certain position.   No abnormal d/c, no itching, burning, irritation or odor.   She and her partner had broken up last year and then got back together. She had negative STD testing last year.   GYNECOLOGIC HISTORY: No LMP recorded. (Menstrual status: IUD). Contraception: IUD Menopausal hormone therapy: none         OB History    Gravida  4   Para  4   Term      Preterm      AB      Living  4     SAB      TAB      Ectopic      Multiple      Live Births                 Patient Active Problem List   Diagnosis Date Noted  . Acute calculous cholecystitis 07/27/2019  . BMI 45.0-49.9, adult ([place]) 01/16/2019  . Prediabetes   . Plantar fasciitis of left foot 08/26/2017  . Pes planus, flexible 08/26/2017  . IUD (intrauterine device) in place 11/14/2013    Past Medical History:  Diagnosis Date  . Anemia   . Prediabetes     Past Surgical History:  Procedure Laterality Date  . BREAST BIOPSY  2000   benign  . CHOLECYSTECTOMY N/A 07/28/2019   Procedure: LAPAROSCOPIC CHOLECYSTECTOMY WITH INTRAOPERATIVE CHOLANGIOGRAM;  Surgeon: Michael Boston, MD;   Location: [facility];  Service: General;  Laterality: N/A;  . TUBAL LIGATION  2004   postpartum    Current Outpatient Medications  Medication Sig Dispense Refill  . Cholecalciferol (VITAMIN D) 125 MCG (5000 UT) CAPS Take 5,000 Units by mouth daily.    Marland Kitchen levonorgestrel (MIRENA) 20 MCG/24HR IUD 1 Intra Uterine Device (1 each total) by Intrauterine route once. 1 Intra Uterine Device 0   No current facility-administered medications for this visit.     ALLERGIES: Patient has no known allergies.  Family History  Problem Relation Age of Onset  . Diabetes Maternal Grandmother   . Congestive Heart Failure Maternal Grandmother   . Depression Mother   . Stroke Mother 50  . Breast cancer Cousin 77       paternal first cousin  . Colon cancer Neg Hx     Social History   Socioeconomic History  . Marital status: Single    Spouse name: Not on file  . Number of children: 4  . Years of education: Not on file  . Highest education level: Not on file  Occupational History  . Occupation: works in Programmer, applications and as an Veterinary surgeon at [employer]  Use  . Smoking status: Never Smoker  . Smokeless tobacco: Never Used  Vaping Use  . Vaping Use: Never used  Substance and Sexual Activity  . Alcohol use: No    Comment: occ wine  . Drug use: No  . Sexual activity: Yes    Partners: Male    Birth control/protection: I.U.D.  Other Topics Concern  . Not on file  Social History Narrative  . Not on file   Social Determinants of Health   Financial Resource Strain:   . Difficulty of Paying Living Expenses: Not on file  Food Insecurity:   . Worried About Charity fundraiser in the Last Year: Not on file  . Ran Out of Food in the Last Year: Not on file  Transportation Needs:   . Lack of Transportation (Medical): Not on file  . Lack of Transportation (Non-Medical): Not on file  Physical Activity:   . Days of Exercise per Week: Not on file  . Minutes of Exercise per Session: Not on  file  Stress:   . Feeling of Stress : Not on file  Social Connections:   . Frequency of Communication with Friends and Family: Not on file  . Frequency of Social Gatherings with Friends and Family: Not on file  . Attends Religious Services: Not on file  . Active Member of Clubs or Organizations: Not on file  . Attends Archivist Meetings: Not on file  . Marital Status: Not on file  Intimate Partner Violence:   . Fear of Current or Ex-Partner: Not on file  . Emotionally Abused: Not on file  . Physically Abused: Not on file  . Sexually Abused: Not on file    Review of Systems  Musculoskeletal: Positive for back pain.  All other systems reviewed and are negative.   PHYSICAL EXAMINATION:    There were no vitals taken for this visit.    General appearance: alert, cooperative and appears stated age  Pelvic: External genitalia:  no lesions              Urethra:  normal appearing urethra with no masses, tenderness or lesions              Bartholins and Skenes: normal                 Vagina: normal appearing vagina with normal color and discharge, no lesions              Cervix: no lesions and IUD string 2 cm, small area of ectropion at 1 o'clock, treated with silver nitrate (didn't seem friable), no cervical motion tenderness              Bimanual Exam:  Uterus:  normal size, contour, position, consistency, mobility, non-tender              Adnexa: no mass, fullness, tenderness                Chaperone was present for exam.  ASSESSMENT Postcoital spotting. No concerning findings on exam H/O CIN I, not due for a pap until 1/22  PLAN Check nuswab for std's F/U for annual/pap in 1/22 Calendar all bleeding, call with any significant changes

## 2020-04-09 NOTE — Telephone Encounter (Signed)
Please let the patient know that I have seen the labs from her primary. She still has prediabetes and her LDL cholesterol is mildly elevated. She should eat a healthy diet low in saturated fats (ie mediterranean diet), exercise regularly.

## 2020-04-11 LAB — CHLAMYDIA/GONOCOCCUS/TRICHOMONAS, NAA
Chlamydia by NAA: NEGATIVE
Gonococcus by NAA: NEGATIVE
Trich vag by NAA: NEGATIVE

## 2020-04-15 NOTE — Telephone Encounter (Signed)
Patient notified.  Closing encounter.

## 2020-05-28 ENCOUNTER — Encounter: Payer: Self-pay | Admitting: Obstetrics and Gynecology

## 2020-05-28 ENCOUNTER — Ambulatory Visit: Payer: 59 | Admitting: Obstetrics and Gynecology

## 2020-05-28 DIAGNOSIS — Z1231 Encounter for screening mammogram for malignant neoplasm of breast: Secondary | ICD-10-CM | POA: Diagnosis not present

## 2020-06-11 DIAGNOSIS — Z23 Encounter for immunization: Secondary | ICD-10-CM | POA: Diagnosis not present

## 2020-07-25 ENCOUNTER — Telehealth: Payer: Self-pay

## 2020-07-25 ENCOUNTER — Encounter: Payer: Self-pay | Admitting: Obstetrics and Gynecology

## 2020-07-25 NOTE — Telephone Encounter (Signed)
Spoke with pt. Pt states wanting to start Gardasil series. Pt states turns 10 in Feb. Pt would like to start series now and check with insurance for future coverage. Pt scheduled as nurse visit on 12/20 at 845 am. Pt advised will review with Dr Talbert Nan and return call with any further recommendations or concerns. Pt agreeable.  Routing to Dr Kendal Hymen for pt to start [facility] series??

## 2020-07-25 NOTE — Telephone Encounter (Signed)
[name]  [facility] Hi there.   With all i had going on last year. I'm considering the guardasil vaccine. Do you all give them there? What are your recommendations?

## 2020-07-26 NOTE — Telephone Encounter (Signed)
Yes, she can absolutely start it. I don't know if her insurance will cover for her to finish the series.

## 2020-07-28 ENCOUNTER — Ambulatory Visit (INDEPENDENT_AMBULATORY_CARE_PROVIDER_SITE_OTHER): Payer: 59

## 2020-07-28 ENCOUNTER — Ambulatory Visit: Payer: Self-pay

## 2020-07-28 ENCOUNTER — Other Ambulatory Visit: Payer: Self-pay

## 2020-07-28 VITALS — BP 120/78 | HR 72 | Ht 63.0 in | Wt 240.0 lb

## 2020-07-28 DIAGNOSIS — Z23 Encounter for immunization: Secondary | ICD-10-CM

## 2020-07-28 NOTE — Telephone Encounter (Signed)
Call to patient. Message given to patient as seen below from Dr. Talbert Nan and patient verbalized understanding. States she will contact insurance company.   Routing to provider and will close encounter.

## 2020-07-28 NOTE — Progress Notes (Signed)
Patient in today for 1st Gardasil injection.   Contraception: IUD/tubal ligation LMP: unknown Last AEX: 05/24/19 with JJ  Injection given in Right Deltoid. Patient tolerated shot well.   Patient informed next injection due in about 2 months.  Advised patient, if not on birth control, to return for next injection with cycle.   Routed to provider for final review.  Encounter closed.

## 2020-09-24 ENCOUNTER — Other Ambulatory Visit: Payer: Self-pay

## 2020-09-24 ENCOUNTER — Ambulatory Visit (INDEPENDENT_AMBULATORY_CARE_PROVIDER_SITE_OTHER): Payer: 59 | Admitting: *Deleted

## 2020-09-24 DIAGNOSIS — Z23 Encounter for immunization: Secondary | ICD-10-CM | POA: Diagnosis not present

## 2020-09-24 NOTE — Progress Notes (Signed)
Patient in today for 2nd Gardasil injection.   Contraception: IUD/Tubal ligation LMP: 11/22/2020 Last AEX: 05/14/19 with JJ  Injection given in Rt arm. Patient tolerated shot well.   Patient informed next injection due in about 4  months.  Advised patient, if not on birth control, to return for next injection with cycle.   Routed to provider for final review.  Encounter closed.

## 2020-09-29 ENCOUNTER — Ambulatory Visit: Payer: 59

## 2020-10-15 ENCOUNTER — Ambulatory Visit: Payer: 59 | Admitting: Obstetrics and Gynecology

## 2020-10-17 ENCOUNTER — Encounter: Payer: Self-pay | Admitting: Obstetrics and Gynecology

## 2020-10-17 ENCOUNTER — Other Ambulatory Visit: Payer: Self-pay

## 2020-10-17 ENCOUNTER — Ambulatory Visit (INDEPENDENT_AMBULATORY_CARE_PROVIDER_SITE_OTHER): Payer: 59 | Admitting: Obstetrics and Gynecology

## 2020-10-17 ENCOUNTER — Other Ambulatory Visit (HOSPITAL_COMMUNITY)
Admission: RE | Admit: 2020-10-17 | Discharge: 2020-10-17 | Disposition: A | Discharge status: Home / Self Care | Payer: 59 | Source: Ambulatory Visit | Attending: Obstetrics and Gynecology | Admitting: Obstetrics and Gynecology

## 2020-10-17 VITALS — BP 122/64 | HR 88 | Ht 62.0 in | Wt 237.0 lb

## 2020-10-17 DIAGNOSIS — R7303 Prediabetes: Secondary | ICD-10-CM | POA: Diagnosis not present

## 2020-10-17 DIAGNOSIS — E559 Vitamin D deficiency, unspecified: Secondary | ICD-10-CM | POA: Diagnosis not present

## 2020-10-17 DIAGNOSIS — Z124 Encounter for screening for malignant neoplasm of cervix: Secondary | ICD-10-CM | POA: Diagnosis not present

## 2020-10-17 DIAGNOSIS — Z30431 Encounter for routine checking of intrauterine contraceptive device: Secondary | ICD-10-CM | POA: Diagnosis not present

## 2020-10-17 DIAGNOSIS — Z1211 Encounter for screening for malignant neoplasm of colon: Secondary | ICD-10-CM

## 2020-10-17 DIAGNOSIS — Z6841 Body Mass Index (BMI) 40.0 and over, adult: Secondary | ICD-10-CM

## 2020-10-17 DIAGNOSIS — R87612 Low grade squamous intraepithelial lesion on cytologic smear of cervix (LGSIL): Secondary | ICD-10-CM | POA: Diagnosis not present

## 2020-10-17 DIAGNOSIS — Z01419 Encounter for gynecological examination (general) (routine) without abnormal findings: Secondary | ICD-10-CM | POA: Diagnosis not present

## 2020-10-17 NOTE — Progress Notes (Signed)
45 y.o. G58P4 Single White or Caucasian Hispanic or Latino female here for annual exam.  She has a mirena IUD, placed in 11/16 for cycle control (H/O TL). Sexually active, same partner. No dyspareunia. No concerns about STD's.  She is teary, moody for a couple days prior to her cycle. Cramps with her cycle, tolerable.  Period Cycle (Days): 28 Period Duration (Days): 3-4 Period Pattern: Regular Menstrual Flow: Light Menstrual Control: Panty liner Menstrual Control Change Freq (Hours): 12 Dysmenorrhea: (!) Mild Dysmenorrhea Symptoms: Cramping  She has mild GSI, few drops, wears a liner. Getting a little better.   She has lost about 20 lbs with portion control.   Patient's last menstrual period was 10/10/2020.          Sexually active: Yes.    The current method of family planning is tubal ligation, mirena for cycle control Exercising: Yes.    The patient has a physically strenuous job, but has no regular exercise apart from work.  Smoker:  no  Health Maintenance: Pap:  08/08/19 LSIL History of abnormal Pap:  Yes. Colpo biopsy from 1/21 with CIN I [facility]:  January 2022 with [facility], normal per patient.  BMD:   n/a Colonoscopy: n/a TDaP:  2019 Gardasil: 2 out of 3 received    reports that she has never smoked. She has never used smokeless tobacco. She reports that she does not drink alcohol and does not use drugs. 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 Working about 56 hours a week. Working towards getting her BSN. 4 kids, youngest is a Paramedic in [employer]. Kids are 37, 25, 21 and 17. Only the youngest is at home.   Past Medical History:  Diagnosis Date  . Anemia   . Prediabetes     Past Surgical History:  Procedure Laterality Date  . BREAST BIOPSY  2000   benign  . CHOLECYSTECTOMY N/A 07/28/2019   Procedure: LAPAROSCOPIC CHOLECYSTECTOMY WITH INTRAOPERATIVE CHOLANGIOGRAM;  Surgeon: Michael Boston, MD;  Location: [facility];  Service: General;  Laterality:  N/A;  . TUBAL LIGATION  2004   postpartum    Current Outpatient Medications  Medication Sig Dispense Refill  . Cholecalciferol (VITAMIN D) 125 MCG (5000 UT) CAPS Take 5,000 Units by mouth daily.    Marland Kitchen levonorgestrel (MIRENA) 20 MCG/24HR IUD 1 Intra Uterine Device (1 each total) by Intrauterine route once. 1 Intra Uterine Device 0  . Multiple Vitamin (MULTI VITAMIN DAILY PO) Take by mouth.     No current facility-administered medications for this visit.    Family History  Problem Relation Age of Onset  . Diabetes Maternal Grandmother   . Congestive Heart Failure Maternal Grandmother   . Depression Mother   . Stroke Mother 2  . Breast cancer Cousin 45       paternal first cousin  . Colon cancer Neg Hx     Review of Systems  Constitutional: Negative.   HENT: Negative.   Eyes: Negative.   Respiratory: Negative.   Cardiovascular: Negative.   Gastrointestinal: Negative.   Endocrine: Negative.   Genitourinary: Negative.   Musculoskeletal: Negative.   Skin: Negative.   Allergic/Immunologic: Negative.   Neurological: Negative.   Hematological: Negative.   Psychiatric/Behavioral: Negative.     Exam:   BP 122/64   Pulse 88   Ht 5' 2"  (1.575 m)   Wt 237 lb (107.5 kg)   LMP 10/10/2020   SpO2 99%   BMI  43.35 kg/m   Weight change: @WEIGHTCHANGE @ Height:   Height: 5' 2"  (157.5 cm)  Ht Readings from Last 3 Encounters:  10/17/20 5' 2"  (1.575 m)  07/28/20 5' 3"  (1.6 m)  04/09/20 5' 3"  (1.6 m)    General appearance: alert, cooperative and appears stated age Head: Normocephalic, without obvious abnormality, atraumatic Neck: no adenopathy, supple, symmetrical, trachea midline and thyroid normal to inspection and palpation Lungs: clear to auscultation bilaterally Cardiovascular: regular rate and rhythm Breasts: normal appearance, no masses or tenderness Abdomen: soft, non-tender; non distended,  no masses,  no organomegaly Extremities: extremities normal, atraumatic, no  cyanosis or edema Skin: Skin color, texture, turgor normal. No rashes or lesions Lymph nodes: Cervical, supraclavicular, and axillary nodes normal. No abnormal inguinal nodes palpated Neurologic: Grossly normal   Pelvic: External genitalia:  no lesions              Urethra:  normal appearing urethra with no masses, tenderness or lesions              Bartholins and Skenes: normal                 Vagina: normal appearing vagina with normal color and discharge, no lesions              Cervix: no lesions               Bimanual Exam:  Uterus:  normal size, contour, position, consistency, mobility, non-tender              Adnexa: no mass, fullness, tenderness               Rectovaginal: Confirms               Anus:  normal sphincter tone, no lesions  Terence Lux chaperoned for the exam  1. Well woman exam Discussed breast self exam Discussed calcium and vit D intake   2. Screening for cervical cancer  - Cytology - PAP  3. LGSIL on Pap smear of cervix   4. Prediabetes Labs with primary   5. Vitamin D deficiency Labs with primary On vit d  6. IUD check up Good for another 1.5 years  7. BMI 40.0-44.9, adult ([place]) Loosing weight   8. Colon cancer screening - Cologuard ordered.

## 2020-10-17 NOTE — Patient Instructions (Signed)
EXERCISE   We recommended that you start or continue a regular exercise program for good health. Physical activity is anything that gets your body moving, some is better than none. The CDC recommends 150 minutes per week of Moderate-Intensity Aerobic Activity and 2 or more days of Muscle Strengthening Activity.  Benefits of exercise are limitless: helps weight loss/weight maintenance, improves mood and energy, helps with depression and anxiety, improves sleep, tones and strengthens muscles, improves balance, improves bone density, protects from chronic conditions such as heart disease, high blood pressure and diabetes and so much more. To learn more visit: https://www.cdc.gov/physicalactivity/index.html  DIET: Good nutrition starts with a healthy diet of fruits, vegetables, whole grains, and lean protein sources. Drink plenty of water for hydration. Minimize empty calories, sodium, sweets. For more information about dietary recommendations visit: https://health.gov/our-work/nutrition-physical-activity/dietary-guidelines and https://www.myplate.gov/  ALCOHOL:  Women should limit their alcohol intake to no more than 7 drinks/beers/glasses of wine (combined, not each!) per week. Moderation of alcohol intake to this level decreases your risk of breast cancer and liver damage.  If you are concerned that you may have a problem, or your friends have told you they are concerned about your drinking, there are many resources to help. A well-known program that is free, effective, and available to all people all over the nation is Alcoholics Anonymous.  Check out this site to learn more: https://www.aa.org/   CALCIUM AND VITAMIN D:  Adequate intake of calcium and Vitamin D are recommended for bone health.  You should be getting between 1000-1200 mg of calcium and 800 units of Vitamin D daily between diet and supplements  PAP SMEARS:  Pap smears, to check for cervical cancer or precancers,  have traditionally been  done yearly, scientific advances have shown that most women can have pap smears less often.  However, every woman still should have a physical exam from her gynecologist every year. It will include a breast check, inspection of the vulva and vagina to check for abnormal growths or skin changes, a visual exam of the cervix, and then an exam to evaluate the size and shape of the uterus and ovaries. We will also provide age appropriate advice regarding health maintenance, like when you should have certain vaccines, screening for sexually transmitted diseases, bone density testing, colonoscopy, mammograms, etc.   MAMMOGRAMS:  All women over 40 years old should have a routine mammogram.   COLON CANCER SCREENING: Now recommend starting at age 45. At this time colonoscopy is not covered for routine screening until 50. There are take home tests that can be done between 45-49.   COLONOSCOPY:  Colonoscopy to screen for colon cancer is recommended for all women at age 50.  We know, you hate the idea of the prep.  We agree, BUT, having colon cancer and not knowing it is worse!!  Colon cancer so often starts as a polyp that can be seen and removed at colonscopy, which can quite literally save your life!  And if your first colonoscopy is normal and you have no family history of colon cancer, most women don't have to have it again for 10 years.  Once every ten years, you can do something that may end up saving your life, right?  We will be happy to help you get it scheduled when you are ready.  Be sure to check your insurance coverage so you understand how much it will cost.  It may be covered as a preventative service at no cost, but you should check   your particular policy.      Breast Self-Awareness Breast self-awareness means being familiar with how your breasts look and feel. It involves checking your breasts regularly and reporting any changes to your health care provider. Practicing breast self-awareness is  important. A change in your breasts can be a sign of a serious medical problem. Being familiar with how your breasts look and feel allows you to find any problems early, when treatment is more likely to be successful. All women should practice breast self-awareness, including women who have had breast implants. How to do a breast self-exam One way to learn what is normal for your breasts and whether your breasts are changing is to do a breast self-exam. To do a breast self-exam: Look for Changes  1. Remove all the clothing above your waist. 2. Stand in front of a mirror in a room with good lighting. 3. Put your hands on your hips. 4. Push your hands firmly downward. 5. Compare your breasts in the mirror. Look for differences between them (asymmetry), such as: ? Differences in shape. ? Differences in size. ? Puckers, dips, and bumps in one breast and not the other. 6. Look at each breast for changes in your skin, such as: ? Redness. ? Scaly areas. 7. Look for changes in your nipples, such as: ? Discharge. ? Bleeding. ? Dimpling. ? Redness. ? A change in position. Feel for Changes Carefully feel your breasts for lumps and changes. It is best to do this while lying on your back on the floor and again while sitting or standing in the shower or tub with soapy water on your skin. Feel each breast in the following way:  Place the arm on the side of the breast you are examining above your head.  Feel your breast with the other hand.  Start in the nipple area and make  inch (2 cm) overlapping circles to feel your breast. Use the pads of your three middle fingers to do this. Apply light pressure, then medium pressure, then firm pressure. The light pressure will allow you to feel the tissue closest to the skin. The medium pressure will allow you to feel the tissue that is a little deeper. The firm pressure will allow you to feel the tissue close to the ribs.  Continue the overlapping circles,  moving downward over the breast until you feel your ribs below your breast.  Move one finger-width toward the center of the body. Continue to use the  inch (2 cm) overlapping circles to feel your breast as you move slowly up toward your collarbone.  Continue the up and down exam using all three pressures until you reach your armpit.  Write Down What You Find  Write down what is normal for each breast and any changes that you find. Keep a written record with breast changes or normal findings for each breast. By writing this information down, you do not need to depend only on memory for size, tenderness, or location. Write down where you are in your menstrual cycle, if you are still menstruating. If you are having trouble noticing differences in your breasts, do not get discouraged. With time you will become more familiar with the variations in your breasts and more comfortable with the exam. How often should I examine my breasts? Examine your breasts every month. If you are breastfeeding, the best time to examine your breasts is after a feeding or after using a breast pump. If you menstruate, the best time to   examine your breasts is 5-7 days after your period is over. During your period, your breasts are lumpier, and it may be more difficult to notice changes. When should I see my health care provider? See your health care provider if you notice:  A change in shape or size of your breasts or nipples.  A change in the skin of your breast or nipples, such as a reddened or scaly area.  Unusual discharge from your nipples.  A lump or thick area that was not there before.  Pain in your breasts.  Anything that concerns you.   Kegel Exercises  Kegel exercises can help strengthen your pelvic floor muscles. The pelvic floor is a group of muscles that support your rectum, small intestine, and bladder. In females, pelvic floor muscles also help support the womb (uterus). These muscles help you  control the flow of urine and stool. Kegel exercises are painless and simple, and they do not require any equipment. Your provider may suggest Kegel exercises to:  Improve bladder and bowel control.  Improve sexual response.  Improve weak pelvic floor muscles after surgery to remove the uterus (hysterectomy) or pregnancy (females).  Improve weak pelvic floor muscles after prostate gland removal or surgery (males). Kegel exercises involve squeezing your pelvic floor muscles, which are the same muscles you squeeze when you try to stop the flow of urine or keep from passing gas. The exercises can be done while sitting, standing, or lying down, but it is best to vary your position. Exercises How to do Kegel exercises: 1. Squeeze your pelvic floor muscles tight. You should feel a tight lift in your rectal area. If you are a female, you should also feel a tightness in your vaginal area. Keep your stomach, buttocks, and legs relaxed. 2. Hold the muscles tight for up to 10 seconds. 3. Breathe normally. 4. Relax your muscles. 5. Repeat as told by your health care provider. Repeat this exercise daily as told by your health care provider. Continue to do this exercise for at least 4-6 weeks, or for as long as told by your health care provider. You may be referred to a physical therapist who can help you learn more about how to do Kegel exercises. Depending on your condition, your health care provider may recommend:  Varying how long you squeeze your muscles.  Doing several sets of exercises every day.  Doing exercises for several weeks.  Making Kegel exercises a part of your regular exercise routine. This information is not intended to replace advice given to you by your health care provider. Make sure you discuss any questions you have with your health care provider. Document Revised: 11/30/2019 Document Reviewed: 03/15/2018 Elsevier Patient Education  [place].

## 2020-10-21 LAB — CYTOLOGY - PAP
Comment: NEGATIVE
Diagnosis: NEGATIVE
Diagnosis: REACTIVE
High risk HPV: NEGATIVE

## 2020-11-27 IMAGING — CT CT ABD-PELV W/ CM
2 of 5 series · 15 of 46 positions shown, 17 images · IV contrast (Omnipaque)
Comparison: CT abdomen and pelvis without and with contrast
05/18/2010

CLINICAL DATA: Generalized abdominal pain. Diarrhea. Right upper
quadrant cramping.

EXAM:
CT ABDOMEN AND PELVIS WITH CONTRAST
TECHNIQUE: Multidetector CT imaging of the abdomen and pelvis was performed
using the standard protocol following bolus administration of
intravenous contrast.
CONTRAST:  100mL OMNIPAQUE IOHEXOL 300 MG/ML  SOLN

[Series 2: axial st · axial · 0.85mm/px · z∈[+769,+1164]mm · 12 of 91 slices shown, 14 images]
[im 6/91  soft-tissue]
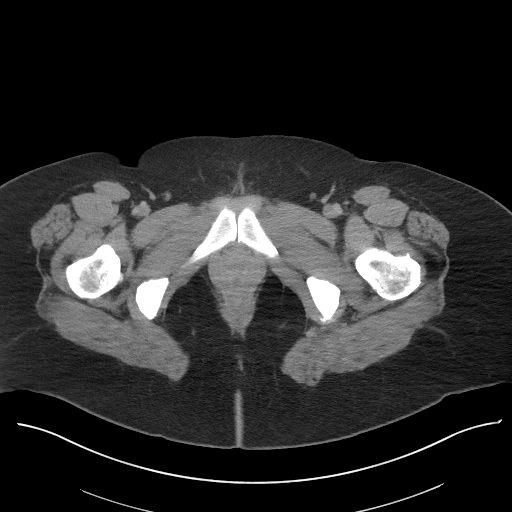
[im 6/91  bone]
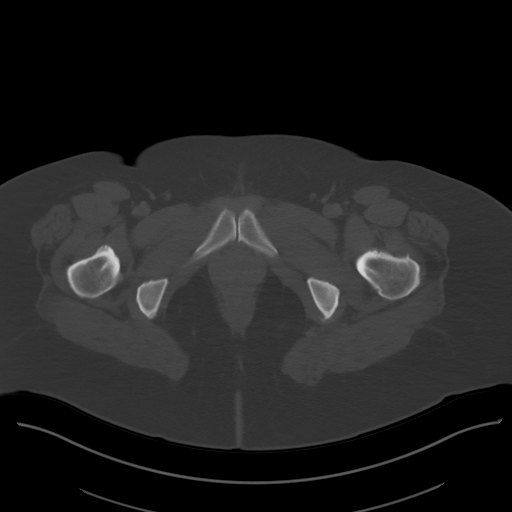
[im 16/91  soft-tissue]
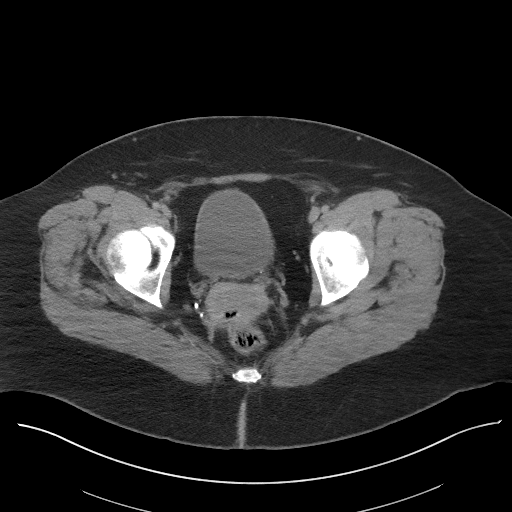
[im 22/91  soft-tissue]
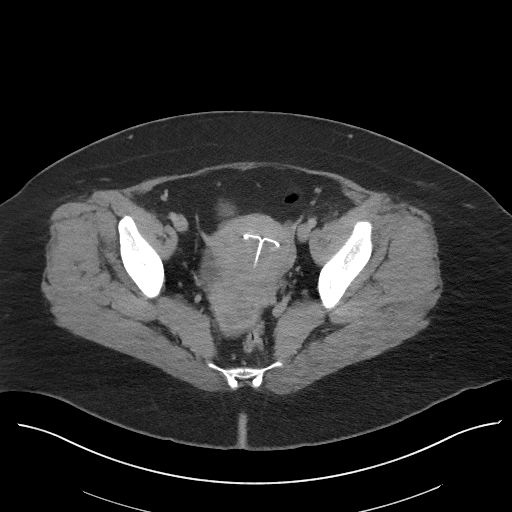
[im 27/91  soft-tissue]
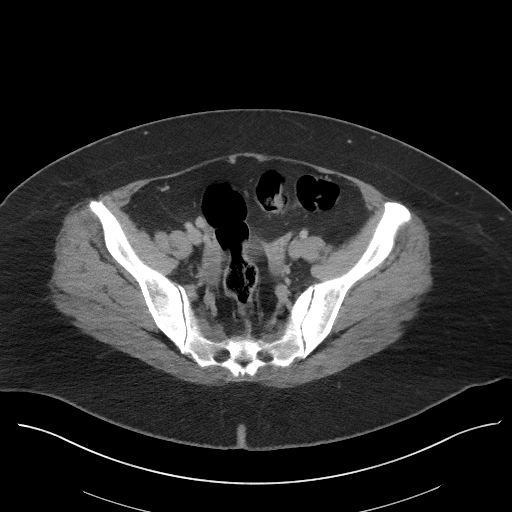
[im 38/91  soft-tissue]
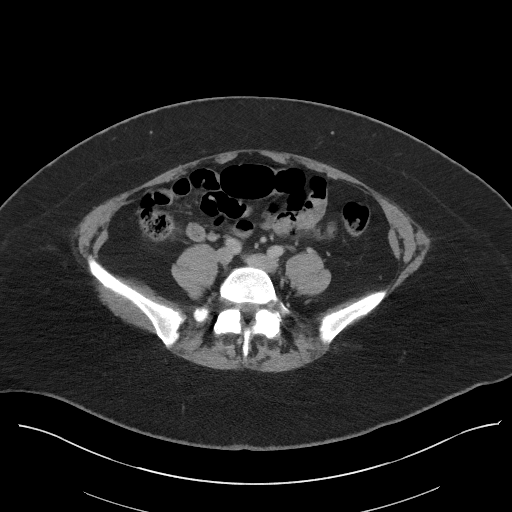
[im 43/91  soft-tissue]
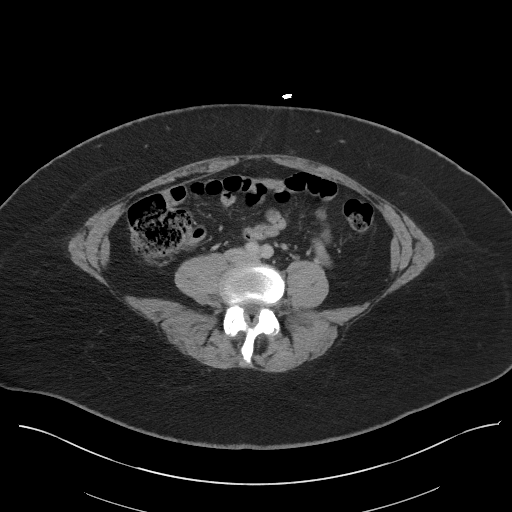
[im 48/91  soft-tissue]
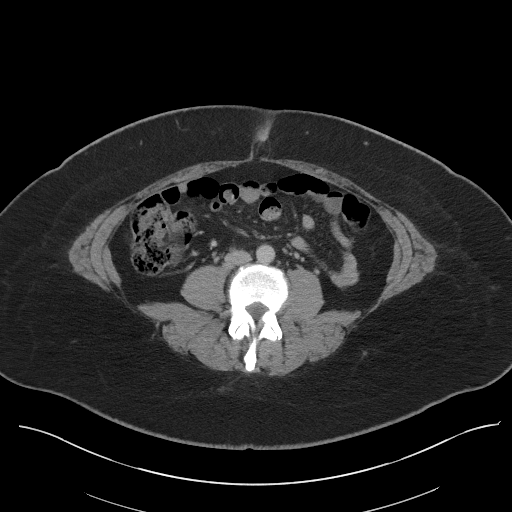
[im 59/91  soft-tissue]
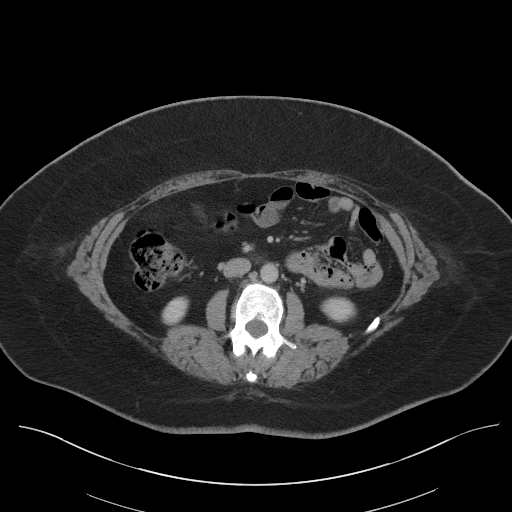
[im 64/91  soft-tissue]
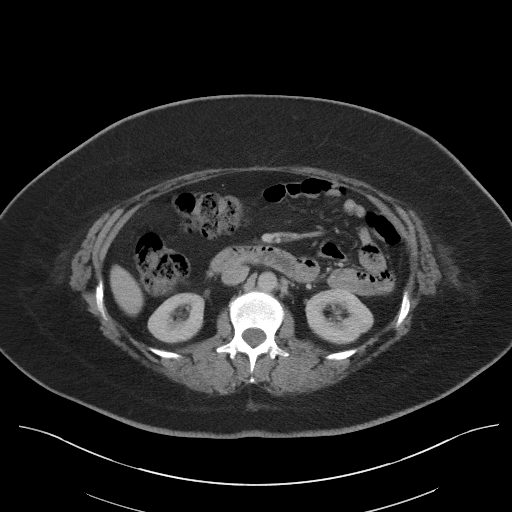
[im 64/91  bone]
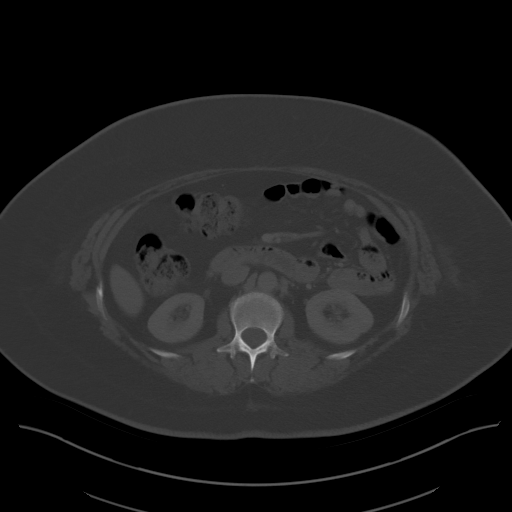
[im 69/91  soft-tissue]
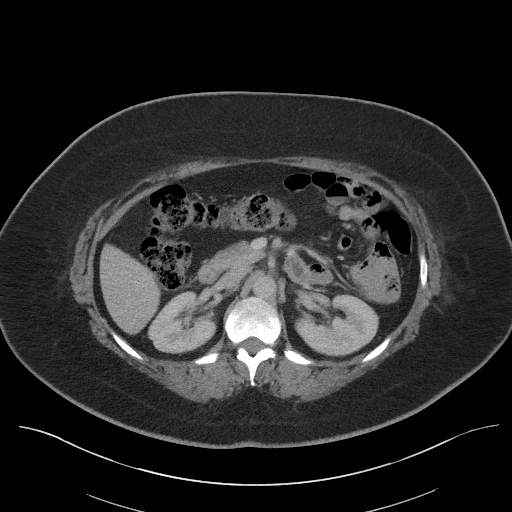
[im 80/91  soft-tissue]
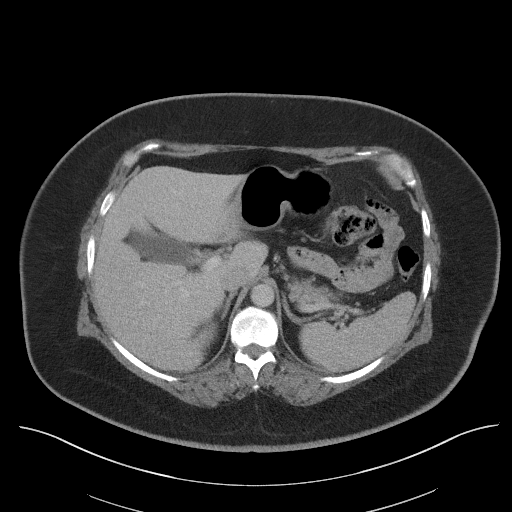
[im 85/91  soft-tissue]
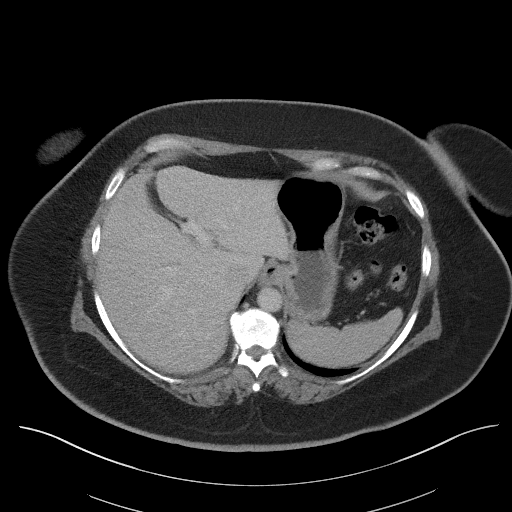

[Series 5: coronal st · coronal · 0.78mm/px · 3 of 101 slices shown]
[im 34/101  soft-tissue]
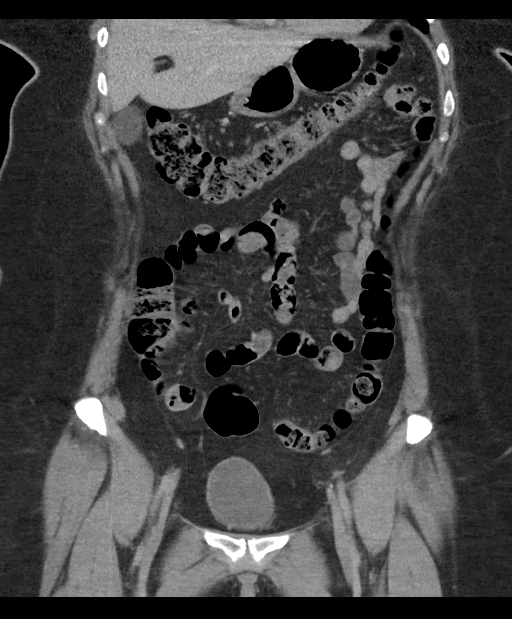
[im 45/101  soft-tissue]
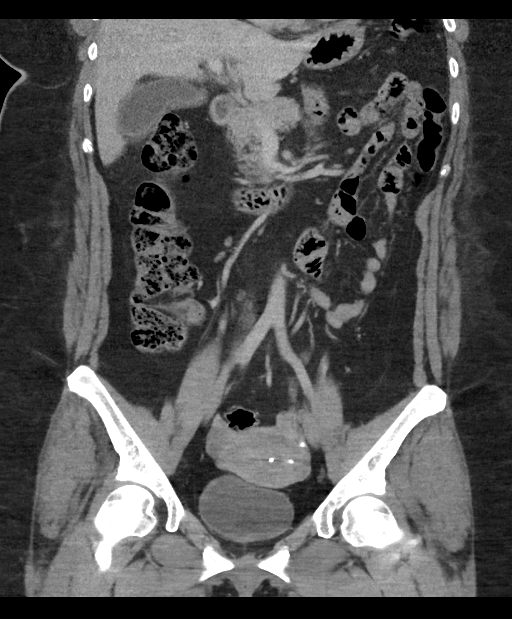
[im 56/101  soft-tissue]
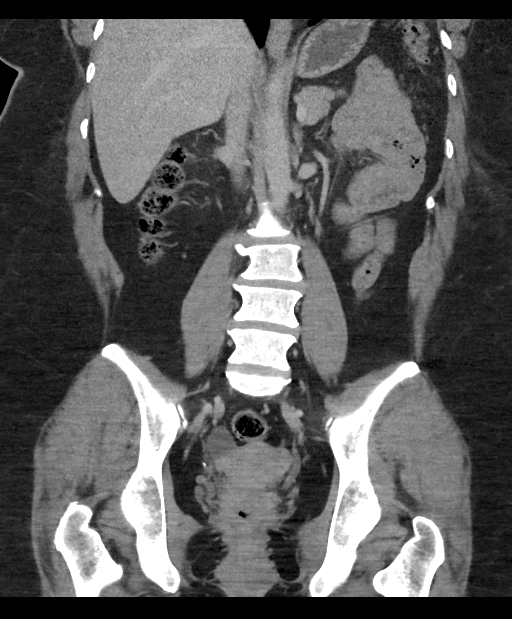

[15 of 46 positions shown; findings below may reference images not displayed]

FINDINGS: Lower chest: Lung bases are clear without focal nodule, mass, or
airspace disease. The heart size is normal. No significant pleural
or pericardial effusion is present.

Hepatobiliary: A peripherally calcified gallstone is present at the
neck of the gallbladder. Mild inflammatory changes are present about
the gallbladder. The common bile duct and gallbladder are normal.
Liver is unremarkable.

Pancreas: Unremarkable. No pancreatic ductal dilatation or
surrounding inflammatory changes.

Spleen: Normal in size without focal abnormality.

Adrenals/Urinary Tract: Adrenal glands are unremarkable. Kidneys are
normal, without renal calculi, focal lesion, or hydronephrosis.
Bladder is unremarkable.

Stomach/Bowel: Stomach and duodenum are within normal limits. Small
bowel is normal. The appendix is visualized and normal. Ascending
and transverse colon are within normal limits. The descending and
sigmoid colon are normal.

Vascular/Lymphatic: No significant vascular findings are present. No
enlarged abdominal or pelvic lymph nodes.

Reproductive: Clips are present in the fallopian tubes bilaterally.
IUD is in place. There is heterogeneous enlargement of the cervix
measuring 3.8 x 6.1 x 4.7 cm. Uterus is otherwise unremarkable.
Adnexa are within normal limits.

Other: No abdominal wall hernia or abnormality. No abdominopelvic
ascites.

Musculoskeletal: Slight degenerative retrolisthesis is present at
C2-3 and C3-4. Superior endplate Schmorl's nodes are present at L1,
L2, and L3. Vertebral body heights are maintained. Alignment is
otherwise anatomic. The bony pelvis is within normal limits. Hips
are located and within normal limits bilaterally.
IMPRESSION: 1. Peripherally calcified gallstone in the neck of the gallbladder
measures 1.9 cm.
2. Mild inflammatory changes about the gallbladder raise concern for
acute cholecystitis. Right upper quadrant ultrasound may be useful
for further evaluation.
3. Enlarged heterogeneous cervix is concerning for malignancy.
Recommend GYN referral.

## 2020-12-22 DIAGNOSIS — Z6841 Body Mass Index (BMI) 40.0 and over, adult: Secondary | ICD-10-CM | POA: Diagnosis not present

## 2020-12-22 DIAGNOSIS — L259 Unspecified contact dermatitis, unspecified cause: Secondary | ICD-10-CM | POA: Diagnosis not present

## 2021-01-26 ENCOUNTER — Ambulatory Visit: Payer: 59

## 2021-01-30 ENCOUNTER — Ambulatory Visit: Payer: 59

## 2021-02-02 ENCOUNTER — Ambulatory Visit (INDEPENDENT_AMBULATORY_CARE_PROVIDER_SITE_OTHER): Payer: 59 | Admitting: Anesthesiology

## 2021-02-02 ENCOUNTER — Other Ambulatory Visit: Payer: Self-pay

## 2021-02-02 DIAGNOSIS — Z23 Encounter for immunization: Secondary | ICD-10-CM

## 2021-03-31 DIAGNOSIS — Z111 Encounter for screening for respiratory tuberculosis: Secondary | ICD-10-CM | POA: Diagnosis not present

## 2021-03-31 DIAGNOSIS — Z0000 Encounter for general adult medical examination without abnormal findings: Secondary | ICD-10-CM | POA: Diagnosis not present

## 2021-03-31 DIAGNOSIS — E559 Vitamin D deficiency, unspecified: Secondary | ICD-10-CM | POA: Diagnosis not present

## 2021-03-31 DIAGNOSIS — L918 Other hypertrophic disorders of the skin: Secondary | ICD-10-CM | POA: Diagnosis not present

## 2021-03-31 DIAGNOSIS — R7303 Prediabetes: Secondary | ICD-10-CM | POA: Diagnosis not present

## 2021-03-31 DIAGNOSIS — E7849 Other hyperlipidemia: Secondary | ICD-10-CM | POA: Diagnosis not present

## 2021-03-31 DIAGNOSIS — E782 Mixed hyperlipidemia: Secondary | ICD-10-CM | POA: Diagnosis not present

## 2021-04-01 ENCOUNTER — Encounter (INDEPENDENT_AMBULATORY_CARE_PROVIDER_SITE_OTHER): Payer: Self-pay | Admitting: *Deleted

## 2021-04-14 DIAGNOSIS — Z111 Encounter for screening for respiratory tuberculosis: Secondary | ICD-10-CM | POA: Diagnosis not present

## 2021-07-18 DIAGNOSIS — Z1231 Encounter for screening mammogram for malignant neoplasm of breast: Secondary | ICD-10-CM | POA: Diagnosis not present

## 2021-07-20 ENCOUNTER — Encounter: Payer: Self-pay | Admitting: Obstetrics and Gynecology

## 2021-10-07 ENCOUNTER — Encounter (INDEPENDENT_AMBULATORY_CARE_PROVIDER_SITE_OTHER): Payer: Self-pay | Admitting: *Deleted

## 2021-10-10 DIAGNOSIS — Z20828 Contact with and (suspected) exposure to other viral communicable diseases: Secondary | ICD-10-CM | POA: Diagnosis not present

## 2021-10-10 DIAGNOSIS — J040 Acute laryngitis: Secondary | ICD-10-CM | POA: Diagnosis not present

## 2021-11-17 NOTE — Progress Notes (Signed)
46 y.o. G30P4 Single White or Caucasian Hispanic or Latino female here for annual exam.  She has a mirena IUD, placed in 11/16 for cycle control (H/O TL).  ?Cycles are light. Not sexually active, recently ended a relationship.  ?Period Cycle (Days): 28 ?Period Duration (Days): 5 ?Period Pattern: Regular ?Menstrual Flow: Moderate ?Dysmenorrhea: (!) Moderate ?Dysmenorrhea Symptoms: Cramping ? ?She is having a lot of upper back, neck and shoulder pain. Considering breast reduction. ? ?She c/o new urge incontinence in the last 2-3 months. 2 episodes of leakage, small amounts. Having the urgency to void.  ? ?Patient's last menstrual period was 11/17/2021.          ?Sexually active: No.  ?The current method of family planning is IUD inserted 06-10-15   ?Exercising: YES   walk ?Smoker:  no ? ?Health Maintenance: ?Pap:  10-17-20 Normal HPV Neg; 12/20 LSIL. ?History of abnormal Pap:  yes Colpo 2021-CIN 1 ?MMG:  07-20-21 Normal Bi Rad 1 ?BMD:   N/A ?Colonoscopy: N/A ?TDaP:  2019  ?Gardasil: Completed ? ? reports that she has never smoked. She has never used smokeless tobacco. She reports current alcohol use. She reports that she does not use drugs. Rare ETOH. 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  ?4 kids, youngest is a Equities trader in [place].  ? ?Past Medical History:  ?Diagnosis Date  ? Anemia   ? Prediabetes   ? ? ?Past Surgical History:  ?Procedure Laterality Date  ? BREAST BIOPSY  2000  ? benign  ? CHOLECYSTECTOMY N/A 07/28/2019  ? Procedure: LAPAROSCOPIC CHOLECYSTECTOMY WITH INTRAOPERATIVE CHOLANGIOGRAM;  Surgeon: Michael Boston, MD;  Location: [facility];  Service: General;  Laterality: N/A;  ? TUBAL LIGATION  2004  ? postpartum  ? ? ?Current Outpatient Medications  ?Medication Sig Dispense Refill  ? Cholecalciferol (VITAMIN D) 125 MCG (5000 UT) CAPS Take 5,000 Units by mouth daily.    ? levonorgestrel (MIRENA) 20 MCG/24HR IUD 1 Intra Uterine Device (1 each total) by Intrauterine route once. 1 Intra Uterine Device 0  ?  Multiple Vitamin (MULTI VITAMIN DAILY PO) Take by mouth.    ? ?No current facility-administered medications for this visit.  ? ? ?Family History  ?Problem Relation Age of Onset  ? Diabetes Maternal Grandmother   ? Congestive Heart Failure Maternal Grandmother   ? Depression Mother   ? Stroke Mother 26  ? Breast cancer Cousin 35  ?     paternal first cousin  ? Colon cancer Neg Hx   ? ? ?Review of Systems  ?All other systems reviewed and are negative. ? ?Exam:   ?BP 134/86 (BP Location: Right Arm, Patient Position: Sitting, Cuff Size: Large)   Pulse 82   Ht 5' 2"  (1.575 m)   Wt 234 lb (106.1 kg)   LMP 11/17/2021   SpO2 98%   BMI 42.80 kg/m?   Weight change: @WEIGHTCHANGE @ Height:   Height: 5' 2"  (157.5 cm)  ?Ht Readings from Last 3 Encounters:  ?11/20/21 5' 2"  (1.575 m)  ?10/17/20 5' 2"  (1.575 m)  ?07/28/20 5' 3"  (1.6 m)  ? ? ?General appearance: alert, cooperative and appears stated age ?Head: Normocephalic, without obvious abnormality, atraumatic ?Neck: no adenopathy, supple, symmetrical, trachea midline and thyroid normal to inspection and palpation ?Lungs: clear to auscultation bilaterally ?Cardiovascular: regular rate and rhythm ?Breasts: normal appearance, no masses or tenderness ?Abdomen: soft, non-tender; non distended,  no masses,  no organomegaly ?Extremities: extremities normal, atraumatic, no cyanosis or edema ?Skin: Skin  color, texture, turgor normal. No rashes or lesions ?Lymph nodes: Cervical, supraclavicular, and axillary nodes normal. ?No abnormal inguinal nodes palpated ?Neurologic: Grossly normal ? ? ?Pelvic: External genitalia:  no lesions ?             Urethra:  normal appearing urethra with no masses, tenderness or lesions ?             Bartholins and Skenes: normal    ?             Vagina: normal appearing vagina with normal color and discharge, no lesions ?             Cervix: no lesions and IUD string 2-3 cm ?              ?Bimanual Exam:  Uterus:   anteverted, mobile, not appreciably  enlarged ?             Adnexa: no mass, fullness, tenderness ?              Rectovaginal: declined ? ?Caryn Bee, CMA chaperoned for the exam. ? ? 1. Well woman exam ?Discussed breast self exam ?Discussed calcium and vit D intake ?Mammogram UTD ? ?2. Screening for cervical cancer ?- Cytology - PAP ? ?3. Screening examination for STD (sexually transmitted disease) ?Declines blood work ?- SURESWAB CT/NG/T. vaginalis ? ?4. Colon cancer screening ?- Ambulatory referral to Gastroenterology ? ?5. IUD check up ?Good for another year.  ? ?6. Urge incontinence ?Discussed limiting caffeine, kegel exercises ?- Urinalysis,Complete w/RFL Culture ? ?

## 2021-11-20 ENCOUNTER — Encounter: Payer: Self-pay | Admitting: Obstetrics and Gynecology

## 2021-11-20 ENCOUNTER — Other Ambulatory Visit (HOSPITAL_COMMUNITY)
Admission: RE | Admit: 2021-11-20 | Discharge: 2021-11-20 | Disposition: A | Discharge status: Home / Self Care | Payer: 59 | Source: Ambulatory Visit | Attending: Obstetrics and Gynecology | Admitting: Obstetrics and Gynecology

## 2021-11-20 ENCOUNTER — Ambulatory Visit (INDEPENDENT_AMBULATORY_CARE_PROVIDER_SITE_OTHER): Payer: 59 | Admitting: Obstetrics and Gynecology

## 2021-11-20 VITALS — BP 134/86 | HR 82 | Ht 62.0 in | Wt 234.0 lb

## 2021-11-20 DIAGNOSIS — Z1211 Encounter for screening for malignant neoplasm of colon: Secondary | ICD-10-CM

## 2021-11-20 DIAGNOSIS — N3941 Urge incontinence: Secondary | ICD-10-CM

## 2021-11-20 DIAGNOSIS — Z113 Encounter for screening for infections with a predominantly sexual mode of transmission: Secondary | ICD-10-CM

## 2021-11-20 DIAGNOSIS — Z30431 Encounter for routine checking of intrauterine contraceptive device: Secondary | ICD-10-CM | POA: Diagnosis not present

## 2021-11-20 DIAGNOSIS — Z124 Encounter for screening for malignant neoplasm of cervix: Secondary | ICD-10-CM

## 2021-11-20 DIAGNOSIS — Z01419 Encounter for gynecological examination (general) (routine) without abnormal findings: Secondary | ICD-10-CM

## 2021-11-20 NOTE — Patient Instructions (Addendum)
EXERCISE   We recommended that you start or continue a regular exercise program for good health. Physical activity is anything that gets your body moving, some is better than none. The CDC recommends 150 minutes per week of Moderate-Intensity Aerobic Activity and 2 or more days of Muscle Strengthening Activity. ? ?Benefits of exercise are limitless: helps weight loss/weight maintenance, improves mood and energy, helps with depression and anxiety, improves sleep, tones and strengthens muscles, improves balance, improves bone density, protects from chronic conditions such as heart disease, high blood pressure and diabetes and so much more. ?To learn more visit: WhyNotPoker.uy ? ?DIET: Good nutrition starts with a healthy diet of fruits, vegetables, whole grains, and lean protein sources. Drink plenty of water for hydration. Minimize empty calories, sodium, sweets. For more information about dietary recommendations visit: GeekRegister.com.ee and http://schaefer-mitchell.com/ ? ?ALCOHOL:  Women should limit their alcohol intake to no more than 7 drinks/beers/glasses of wine (combined, not each!) per week. Moderation of alcohol intake to this level decreases your risk of breast cancer and liver damage.  If you are concerned that you may have a problem, or your friends have told you they are concerned about your drinking, there are many resources to help. A well-known program that is free, effective, and available to all people all over the nation is Alcoholics Anonymous.  Check out this site to learn more: BlockTaxes.se ? ? ?CALCIUM AND VITAMIN D:  Adequate intake of calcium and Vitamin D are recommended for bone health.  You should be getting between 1000-1200 mg of calcium and 800 units of Vitamin D daily between diet and supplements ? ?PAP SMEARS:  Pap smears, to check for cervical cancer or precancers,  have traditionally been  done yearly, scientific advances have shown that most women can have pap smears less often.  However, every woman still should have a physical exam from her gynecologist every year. It will include a breast check, inspection of the vulva and vagina to check for abnormal growths or skin changes, a visual exam of the cervix, and then an exam to evaluate the size and shape of the uterus and ovaries. We will also provide age appropriate advice regarding health maintenance, like when you should have certain vaccines, screening for sexually transmitted diseases, bone density testing, colonoscopy, mammograms, etc.  ? ?MAMMOGRAMS:  All women over 82 years old should have a routine mammogram.  ? ?COLON CANCER SCREENING: Now recommend starting at age 60. At this time colonoscopy is not covered for routine screening until 50. There are take home tests that can be done between 45-49.  ? ?COLONOSCOPY:  Colonoscopy to screen for colon cancer is recommended for all women at age 27.  We know, you hate the idea of the prep.  We agree, BUT, having colon cancer and not knowing it is worse!!  Colon cancer so often starts as a polyp that can be seen and removed at colonscopy, which can quite literally save your life!  And if your first colonoscopy is normal and you have no family history of colon cancer, most women don't have to have it again for 10 years.  Once every ten years, you can do something that may end up saving your life, right?  We will be happy to help you get it scheduled when you are ready.  Be sure to check your insurance coverage so you understand how much it will cost.  It may be covered as a preventative service at no cost, but you should check  your particular policy.   ? ? ? ?Breast Self-Awareness ?Breast self-awareness means being familiar with how your breasts look and feel. It involves checking your breasts regularly and reporting any changes to your health care provider. ?Practicing breast self-awareness is  important. A change in your breasts can be a sign of a serious medical problem. Being familiar with how your breasts look and feel allows you to find any problems early, when treatment is more likely to be successful. All women should practice breast self-awareness, including women who have had breast implants. ?How to do a breast self-exam ?One way to learn what is normal for your breasts and whether your breasts are changing is to do a breast self-exam. To do a breast self-exam: ?Look for Changes ? ?Remove all the clothing above your waist. ?Stand in front of a mirror in a room with good lighting. ?Put your hands on your hips. ?Push your hands firmly downward. ?Compare your breasts in the mirror. Look for differences between them (asymmetry), such as: ?Differences in shape. ?Differences in size. ?Puckers, dips, and bumps in one breast and not the other. ?Look at each breast for changes in your skin, such as: ?Redness. ?Scaly areas. ?Look for changes in your nipples, such as: ?Discharge. ?Bleeding. ?Dimpling. ?Redness. ?A change in position. ?Feel for Changes ?Carefully feel your breasts for lumps and changes. It is best to do this while lying on your back on the floor and again while sitting or standing in the shower or tub with soapy water on your skin. Feel each breast in the following way: ?Place the arm on the side of the breast you are examining above your head. ?Feel your breast with the other hand. ?Start in the nipple area and make ? inch (2 cm) overlapping circles to feel your breast. Use the pads of your three middle fingers to do this. Apply light pressure, then medium pressure, then firm pressure. The light pressure will allow you to feel the tissue closest to the skin. The medium pressure will allow you to feel the tissue that is a little deeper. The firm pressure will allow you to feel the tissue close to the ribs. ?Continue the overlapping circles, moving downward over the breast until you feel your  ribs below your breast. ?Move one finger-width toward the center of the body. Continue to use the ? inch (2 cm) overlapping circles to feel your breast as you move slowly up toward your collarbone. ?Continue the up and down exam using all three pressures until you reach your armpit. ? ?Write Down What You Find ? ?Write down what is normal for each breast and any changes that you find. Keep a written record with breast changes or normal findings for each breast. By writing this information down, you do not need to depend only on memory for size, tenderness, or location. Write down where you are in your menstrual cycle, if you are still menstruating. ?If you are having trouble noticing differences in your breasts, do not get discouraged. With time you will become more familiar with the variations in your breasts and more comfortable with the exam. ?How often should I examine my breasts? ?Examine your breasts every month. If you are breastfeeding, the best time to examine your breasts is after a feeding or after using a breast pump. If you menstruate, the best time to examine your breasts is 5-7 days after your period is over. During your period, your breasts are lumpier, and it may be more  difficult to notice changes. ?When should I see my health care provider? ?See your health care provider if you notice: ?A change in shape or size of your breasts or nipples. ?A change in the skin of your breast or nipples, such as a reddened or scaly area. ?Unusual discharge from your nipples. ?A lump or thick area that was not there before. ?Pain in your breasts. ?Anything that concerns you. ?Kegel Exercises ? ?Kegel exercises can help strengthen your pelvic floor muscles. The pelvic floor is a group of muscles that support your rectum, small intestine, and bladder. In females, pelvic floor muscles also help support the uterus. These muscles help you control the flow of urine and stool (feces). ?Kegel exercises are painless and  simple. They do not require any equipment. Your provider may suggest Kegel exercises to: ?Improve bladder and bowel control. ?Improve sexual response. ?Improve weak pelvic floor muscles after surgery to remove

## 2021-11-21 LAB — SURESWAB CT/NG/T. VAGINALIS
C. trachomatis RNA, TMA: NOT DETECTED
N. gonorrhoeae RNA, TMA: NOT DETECTED
Trichomonas vaginalis RNA: NOT DETECTED

## 2021-11-22 LAB — URINALYSIS, COMPLETE W/RFL CULTURE
Bacteria, UA: NONE SEEN /HPF
Bilirubin Urine: NEGATIVE
Glucose, UA: NEGATIVE
Hyaline Cast: NONE SEEN /LPF
Ketones, ur: NEGATIVE
Leukocyte Esterase: NEGATIVE
Nitrites, Initial: NEGATIVE
Protein, ur: NEGATIVE
Specific Gravity, Urine: 1.025 (ref 1.001–1.035)
WBC, UA: NONE SEEN /HPF (ref 0–5)
pH: 5.5 (ref 5.0–8.0)

## 2021-11-22 LAB — URINE CULTURE
MICRO NUMBER:: 13265283
SPECIMEN QUALITY:: ADEQUATE

## 2021-11-22 LAB — CULTURE INDICATED

## 2021-11-27 LAB — CYTOLOGY - PAP
Comment: NEGATIVE
Diagnosis: UNDETERMINED — AB
High risk HPV: NEGATIVE

## 2021-12-17 ENCOUNTER — Other Ambulatory Visit: Payer: Self-pay

## 2021-12-17 ENCOUNTER — Telehealth: Payer: Self-pay

## 2021-12-17 DIAGNOSIS — Z1211 Encounter for screening for malignant neoplasm of colon: Secondary | ICD-10-CM

## 2021-12-17 NOTE — Telephone Encounter (Signed)
Patient called stating she has not heard regarding colonoscopy referral. ? ?I called her back but voice mail box is full. Patient can call [phone] to schedule. ?

## 2021-12-24 NOTE — Telephone Encounter (Signed)
I sent patient My Chart message and let her know order has been placed and she can call that office to schedule if she would like. They will call her but it might take some time. I provided her the phone number.

## 2021-12-28 ENCOUNTER — Encounter: Payer: Self-pay | Admitting: Gastroenterology

## 2021-12-31 ENCOUNTER — Ambulatory Visit (AMBULATORY_SURGERY_CENTER): Payer: Self-pay | Admitting: *Deleted

## 2021-12-31 VITALS — Ht 63.0 in | Wt 235.0 lb

## 2021-12-31 DIAGNOSIS — Z1211 Encounter for screening for malignant neoplasm of colon: Secondary | ICD-10-CM

## 2021-12-31 MED ORDER — NA SULFATE-K SULFATE-MG SULF 17.5-3.13-1.6 GM/177ML PO SOLN: 2.0000 | Freq: Once | ORAL | 0 refills | Status: AC

## 2021-12-31 NOTE — Progress Notes (Signed)
No egg or soy allergy known to patient  No issues known to pt with past sedation with any surgeries or procedures Patient denies ever being told they had issues or difficulty with intubation  No FH of Malignant Hyperthermia Pt is not on diet pills Pt is not on  home 02  Pt is not on blood thinners  Pt denies issues with constipation  No A fib or A flutter  Pt instructed to use Singlecare.com or GoodRx for a price reduction on prep   PV completed in person Pt verified name, DOB, address and insurance during PV today.  Pt given instruction packet with copy of consent form to read and sign after questions answered. Pt encouraged to call with questions or issues.   Insurance confirmed with pt at [facility] today

## 2022-01-27 ENCOUNTER — Encounter: Payer: 59 | Admitting: Gastroenterology

## 2022-03-11 ENCOUNTER — Encounter: Payer: Self-pay | Admitting: Gastroenterology

## 2022-04-05 DIAGNOSIS — M542 Cervicalgia: Secondary | ICD-10-CM | POA: Diagnosis not present

## 2022-04-05 DIAGNOSIS — Z0000 Encounter for general adult medical examination without abnormal findings: Secondary | ICD-10-CM | POA: Diagnosis not present

## 2022-04-05 DIAGNOSIS — Z6841 Body Mass Index (BMI) 40.0 and over, adult: Secondary | ICD-10-CM | POA: Diagnosis not present

## 2022-04-05 DIAGNOSIS — E559 Vitamin D deficiency, unspecified: Secondary | ICD-10-CM | POA: Diagnosis not present

## 2022-04-05 DIAGNOSIS — R030 Elevated blood-pressure reading, without diagnosis of hypertension: Secondary | ICD-10-CM | POA: Diagnosis not present

## 2022-04-05 DIAGNOSIS — R739 Hyperglycemia, unspecified: Secondary | ICD-10-CM | POA: Diagnosis not present

## 2022-04-05 DIAGNOSIS — E782 Mixed hyperlipidemia: Secondary | ICD-10-CM | POA: Diagnosis not present

## 2022-04-05 DIAGNOSIS — M545 Low back pain, unspecified: Secondary | ICD-10-CM | POA: Diagnosis not present

## 2022-04-05 DIAGNOSIS — E7849 Other hyperlipidemia: Secondary | ICD-10-CM | POA: Diagnosis not present

## 2022-04-05 DIAGNOSIS — M25511 Pain in right shoulder: Secondary | ICD-10-CM | POA: Diagnosis not present

## 2022-05-03 ENCOUNTER — Ambulatory Visit (AMBULATORY_SURGERY_CENTER): Payer: Self-pay | Admitting: *Deleted

## 2022-05-03 ENCOUNTER — Other Ambulatory Visit: Payer: Self-pay

## 2022-05-03 VITALS — Ht 63.0 in | Wt 235.0 lb

## 2022-05-03 DIAGNOSIS — Z1211 Encounter for screening for malignant neoplasm of colon: Secondary | ICD-10-CM

## 2022-05-03 NOTE — Progress Notes (Signed)
Pre visit completed in person, Patient had suprep from a previously scheduled colonoscopy.  No egg or soy allergy known to patient  No issues known to pt with past sedation with any surgeries or procedures Patient denies ever being told they had issues or difficulty with intubation  No FH of Malignant Hyperthermia Pt is not on diet pills Pt is not on  home 02  Pt is not on blood thinners  Pt denies issues with constipation  No A fib or A flutter

## 2022-05-06 ENCOUNTER — Encounter: Payer: Self-pay | Admitting: Gastroenterology

## 2022-05-19 ENCOUNTER — Encounter: Payer: 59 | Admitting: Gastroenterology

## 2022-05-19 ENCOUNTER — Telehealth: Payer: Self-pay | Admitting: Gastroenterology

## 2022-05-19 NOTE — Telephone Encounter (Signed)
I'm sorry that she is sick. Please place recall in system for 69-month, if she doesn't call in sooner. Thanks. GM

## 2022-05-19 NOTE — Telephone Encounter (Signed)
Good Morning Dr. Rush Landmark,  I called this patient at 7:15am today to see if she was coming for there procedure.  She stated she called several times yesterday but could not get through.    She sated that she was feeling sick with headache and nausea.    She will call back to reschedule once feeling better.  I will NO SHOW her.

## 2022-06-03 DIAGNOSIS — Z6841 Body Mass Index (BMI) 40.0 and over, adult: Secondary | ICD-10-CM | POA: Diagnosis not present

## 2022-06-03 DIAGNOSIS — J0101 Acute recurrent maxillary sinusitis: Secondary | ICD-10-CM | POA: Diagnosis not present

## 2022-06-03 DIAGNOSIS — R030 Elevated blood-pressure reading, without diagnosis of hypertension: Secondary | ICD-10-CM | POA: Diagnosis not present

## 2022-06-03 DIAGNOSIS — Z20828 Contact with and (suspected) exposure to other viral communicable diseases: Secondary | ICD-10-CM | POA: Diagnosis not present

## 2022-07-05 DIAGNOSIS — K648 Other hemorrhoids: Secondary | ICD-10-CM | POA: Diagnosis not present

## 2022-07-05 DIAGNOSIS — Z1211 Encounter for screening for malignant neoplasm of colon: Secondary | ICD-10-CM | POA: Diagnosis not present

## 2022-07-20 DIAGNOSIS — Z1231 Encounter for screening mammogram for malignant neoplasm of breast: Secondary | ICD-10-CM | POA: Diagnosis not present

## 2022-07-22 ENCOUNTER — Encounter: Payer: Self-pay | Admitting: Obstetrics and Gynecology

## 2022-07-22 DIAGNOSIS — J9801 Acute bronchospasm: Secondary | ICD-10-CM | POA: Diagnosis not present

## 2022-07-22 DIAGNOSIS — Z6841 Body Mass Index (BMI) 40.0 and over, adult: Secondary | ICD-10-CM | POA: Diagnosis not present

## 2022-07-22 DIAGNOSIS — R059 Cough, unspecified: Secondary | ICD-10-CM | POA: Diagnosis not present

## 2022-07-22 DIAGNOSIS — R030 Elevated blood-pressure reading, without diagnosis of hypertension: Secondary | ICD-10-CM | POA: Diagnosis not present

## 2022-12-28 NOTE — Progress Notes (Signed)
47 y.o. G32P4 Single White or Caucasian Hispanic or Latino female here for annual exam.  She has a mirena IUD, placed in 11/16 for cycle control.  She has monthly cycles x 2-5 days. Vary from light to heavy, getting heavier. Can saturate a pad in up to 8 hours. No BTB.  Sexually active, back with her ex. No STD concerns. No dyspareunia.  No bowel or bladder c/o. Mild GSI, some urgency to void at times.     No LMP recorded. (Menstrual status: IUD).          Sexually active: Yes.    The current method of family planning is TL and IUD.    Exercising: Yes.     Weights  Smoker:  no  Health Maintenance: Pap:  11/20/21 ASCUS Hr hpv neg  10-17-20 Normal HPV Neg; 12/20 LSIL.  History of abnormal Pap:  yes Colpo 2021-CIN 1  MMG:  07/20/22 Bi-rads 1 neg  BMD:   n/a Colonoscopy: 11/23, f/u  TDaP:  2019 Gardasil: n/a   reports that she has never smoked. She has never used smokeless tobacco. She reports current alcohol use. She reports that she does not use drugs. Just occasional ETOH. Works as a Public house manager. Planning on getting her BSN, starting school in August.  4 kids  Past Medical History:  Diagnosis Date   Anemia    Prediabetes     Past Surgical History:  Procedure Laterality Date   BREAST BIOPSY  08/09/1998   benign   CHOLECYSTECTOMY N/A 07/28/2019   Procedure: LAPAROSCOPIC CHOLECYSTECTOMY WITH INTRAOPERATIVE CHOLANGIOGRAM;  Surgeon: Karie Soda, MD;  Location: [facility];  Service: General;  Laterality: N/A;   TUBAL LIGATION  08/09/2002   postpartum    Current Outpatient Medications  Medication Sig Dispense Refill   ascorbic acid (VITAMIN C) 500 MG tablet Take 500 mg by mouth.     Cholecalciferol (VITAMIN D) 125 MCG (5000 UT) CAPS Take 5,000 Units by mouth daily.     cyanocobalamin (VITAMIN B12) 500 MCG tablet Take 500 mcg by mouth daily.     levonorgestrel (MIRENA) 20 MCG/24HR IUD 1 Intra Uterine Device (1 each total) by Intrauterine route once. 1 Intra Uterine Device 0   magnesium  gluconate (MAGONATE) 500 MG tablet Take 500 mg by mouth 2 (two) times daily.     Multiple Vitamin (MULTI VITAMIN DAILY PO) Take by mouth.     No current facility-administered medications for this visit.    Family History  Problem Relation Age of Onset   Depression Mother    Stroke Mother 50   Diabetes Maternal Grandmother    Congestive Heart Failure Maternal Grandmother    Breast cancer Cousin 62       paternal first cousin   Colon cancer Neg Hx    Colon polyps Neg Hx    Esophageal cancer Neg Hx    Stomach cancer Neg Hx    Rectal cancer Neg Hx     Review of Systems  All other systems reviewed and are negative.   Exam:   BP 118/70   Pulse 85   Ht 5\' 3"  (1.6 m)   Wt 243 lb (110.2 kg)   SpO2 100%   BMI 43.05 kg/m   Weight change: @WEIGHTCHANGE @ Height:   Height: 5\' 3"  (160 cm)  Ht Readings from Last 3 Encounters:  01/04/23 5\' 3"  (1.6 m)  05/03/22 5\' 3"  (1.6 m)  12/31/21 5\' 3"  (1.6 m)    General appearance: alert, cooperative and appears stated  age Head: Normocephalic, without obvious abnormality, atraumatic Neck: no adenopathy, supple, symmetrical, trachea midline and thyroid normal to inspection and palpation Lungs: clear to auscultation bilaterally Cardiovascular: regular rate and rhythm Breasts: normal appearance, no masses or tenderness Abdomen: soft, non-tender; non distended,  no masses,  no organomegaly Extremities: extremities normal, atraumatic, no cyanosis or edema Skin: Skin color, texture, turgor normal. No rashes or lesions Lymph nodes: Cervical, supraclavicular, and axillary nodes normal. No abnormal inguinal nodes palpated Neurologic: Grossly normal   Pelvic: External genitalia:  no lesions              Urethra:  normal appearing urethra with no masses, tenderness or lesions              Bartholins and Skenes: normal                 Vagina: normal appearing vagina with normal color and discharge, no lesions              Cervix: no lesions and IUD  string  2-3 cm               Bimanual Exam:  Uterus:  normal size, contour, position, consistency, mobility, non-tender and anteverted              Adnexa: no mass, fullness, tenderness               Rectovaginal: Confirms               Anus:  normal sphincter tone, no lesions  Carolynn Serve, CMA chaperoned for the exam.  1. Well woman exam Discussed breast self exam Discussed calcium and vit D intake Mammogram and colonoscopy UTD Labs with primary No pap this year  2. IUD check up Has IUD exchange set up later this week

## 2023-01-04 ENCOUNTER — Ambulatory Visit (INDEPENDENT_AMBULATORY_CARE_PROVIDER_SITE_OTHER): Payer: PRIVATE HEALTH INSURANCE | Admitting: Obstetrics and Gynecology

## 2023-01-04 ENCOUNTER — Encounter: Payer: Self-pay | Admitting: Obstetrics and Gynecology

## 2023-01-04 VITALS — BP 118/70 | HR 85 | Ht 63.0 in | Wt 243.0 lb

## 2023-01-04 DIAGNOSIS — Z30431 Encounter for routine checking of intrauterine contraceptive device: Secondary | ICD-10-CM | POA: Diagnosis not present

## 2023-01-04 DIAGNOSIS — Z01419 Encounter for gynecological examination (general) (routine) without abnormal findings: Secondary | ICD-10-CM

## 2023-01-04 NOTE — Patient Instructions (Signed)

## 2023-01-05 ENCOUNTER — Other Ambulatory Visit: Payer: Self-pay

## 2023-01-05 DIAGNOSIS — Z30433 Encounter for removal and reinsertion of intrauterine contraceptive device: Secondary | ICD-10-CM

## 2023-01-06 ENCOUNTER — Ambulatory Visit: Payer: PRIVATE HEALTH INSURANCE | Admitting: Obstetrics and Gynecology

## 2023-01-06 ENCOUNTER — Encounter: Payer: Self-pay | Admitting: Obstetrics and Gynecology

## 2023-01-06 VITALS — BP 120/70 | HR 62 | Wt 244.0 lb

## 2023-01-06 DIAGNOSIS — Z538 Procedure and treatment not carried out for other reasons: Secondary | ICD-10-CM

## 2023-01-06 DIAGNOSIS — Z30433 Encounter for removal and reinsertion of intrauterine contraceptive device: Secondary | ICD-10-CM | POA: Diagnosis not present

## 2023-01-06 DIAGNOSIS — Z30432 Encounter for removal of intrauterine contraceptive device: Secondary | ICD-10-CM

## 2023-01-06 DIAGNOSIS — N882 Stricture and stenosis of cervix uteri: Secondary | ICD-10-CM

## 2023-01-06 MED ORDER — LORAZEPAM 1 MG PO TABS
ORAL_TABLET | ORAL | 0 refills | Status: DC
Start: 2023-01-06 — End: 2024-05-02

## 2023-01-06 MED ORDER — MISOPROSTOL 200 MCG PO TABS: ORAL_TABLET | ORAL | 0 refills | Status: DC

## 2023-01-06 NOTE — Addendum Note (Signed)
Addended by: Gertie Exon E on: 01/06/2023 04:00 PM   Modules accepted: Orders

## 2023-01-06 NOTE — Progress Notes (Signed)
GYNECOLOGY  VISIT   HPI: 47 y.o.   Single White or Caucasian Hispanic or Latino  female   G97P4 with No LMP recorded. (Menstrual status: IUD).   here for IUD exchange. She has a mirena IUD for cycle control, placed in 11/16. Recently cycles have been getting heavier.   GYNECOLOGIC HISTORY: No LMP recorded. (Menstrual status: IUD). Contraception:Tubal ligation Menopausal hormone therapy: none         OB History     Gravida  4   Para  4   Term      Preterm      AB      Living  4      SAB      IAB      Ectopic      Multiple      Live Births                 Patient Active Problem List   Diagnosis Date Noted   Acute calculous cholecystitis 07/27/2019   BMI 45.0-49.9, adult (HCC) 01/16/2019   Prediabetes    Plantar fasciitis of left foot 08/26/2017   Pes planus, flexible 08/26/2017   IUD (intrauterine device) in place 11/14/2013    Past Medical History:  Diagnosis Date   Anemia    Prediabetes     Past Surgical History:  Procedure Laterality Date   BREAST BIOPSY  08/09/1998   benign   CHOLECYSTECTOMY N/A 07/28/2019   Procedure: LAPAROSCOPIC CHOLECYSTECTOMY WITH INTRAOPERATIVE CHOLANGIOGRAM;  Surgeon: Karie Soda, MD;  Location: [facility];  Service: General;  Laterality: N/A;   TUBAL LIGATION  08/09/2002   postpartum    Current Outpatient Medications  Medication Sig Dispense Refill   ascorbic acid (VITAMIN C) 500 MG tablet Take 500 mg by mouth.     Cholecalciferol (VITAMIN D) 125 MCG (5000 UT) CAPS Take 5,000 Units by mouth daily.     cyanocobalamin (VITAMIN B12) 500 MCG tablet Take 500 mcg by mouth daily.     levonorgestrel (MIRENA) 20 MCG/24HR IUD 1 Intra Uterine Device (1 each total) by Intrauterine route once. 1 Intra Uterine Device 0   magnesium gluconate (MAGONATE) 500 MG tablet Take 500 mg by mouth 2 (two) times daily.     Multiple Vitamin (MULTI VITAMIN DAILY PO) Take by mouth.     No current facility-administered medications for this visit.      ALLERGIES: Patient has no known allergies.  Family History  Problem Relation Age of Onset   Depression Mother    Stroke Mother 53   Diabetes Maternal Grandmother    Congestive Heart Failure Maternal Grandmother    Breast cancer Cousin 104       paternal first cousin   Colon cancer Neg Hx    Colon polyps Neg Hx    Esophageal cancer Neg Hx    Stomach cancer Neg Hx    Rectal cancer Neg Hx     Social History   Socioeconomic History   Marital status: Single    Spouse name: Not on file   Number of children: 4   Years of education: Not on file   Highest education level: Not on file  Occupational History   Occupation: works in Presenter, broadcasting and as an Engineer, technical sales at [employer]   Tobacco Use   Smoking status: Never   Smokeless tobacco: Never  Vaping Use   Vaping Use: Never used  Substance and Sexual Activity   Alcohol use: Yes    Comment: occ wine  Drug use: No   Sexual activity: Not Currently    Partners: Male    Birth control/protection: I.U.D.  Other Topics Concern   Not on file  Social History Narrative   Not on file   Social Determinants of Health   Financial Resource Strain: Not on file  Food Insecurity: Not on file  Transportation Needs: Not on file  Physical Activity: Not on file  Stress: Not on file  Social Connections: Not on file  Intimate Partner Violence: Not on file    ROS  PHYSICAL EXAMINATION:    BP 120/70   Pulse 62   Wt 244 lb (110.7 kg)   SpO2 100%   BMI 43.22 kg/m     General appearance: alert, cooperative and appears stated age  Pelvic: External genitalia:  no lesions              Urethra:  normal appearing urethra with no masses, tenderness or lesions              Bartholins and Skenes: normal                 Vagina: normal appearing vagina with normal color and discharge, no lesions              Cervix: no lesions and IUD strings 3-4 cm               The risks of the mirena IUD were reviewed with the patient,  including infection, abnormal bleeding and uterine perfortion. Consent was signed.  A speculum was placed in the vagina, the cervix was cleansed with betadine. The old IUD was removed with ringed forceps. A tenaculum was placed on the cervix, I was unable to dilate the cervix with the mini-dilators or the os finder. The tenaculum and speculum were removed.   Chaperone, Carolynn Serve, CMA was present for exam.  1. Encounter for IUD removal IUD removed  2. Unsuccessful attempt to insert intrauterine device (IUD) Unable to get past her internal os with mini-dilators or the os finder. On review of her u/s from 1/21 she had many large nabothian cysts, ? Deviating the cervical os. Will have her return for U/S guided IUD insertion, will pre-treat with cytotec -Will pretreat with ativan  3. Cervical stenosis (uterine cervix) - misoprostol (CYTOTEC) 200 MCG tablet; Place 2 tablets vaginally 6-12 hours prior to IUD insertion  Dispense: 2 tablet; Refill: 0

## 2023-02-01 ENCOUNTER — Ambulatory Visit (INDEPENDENT_AMBULATORY_CARE_PROVIDER_SITE_OTHER): Payer: PRIVATE HEALTH INSURANCE | Admitting: Obstetrics and Gynecology

## 2023-02-01 ENCOUNTER — Encounter: Payer: Self-pay | Admitting: Obstetrics and Gynecology

## 2023-02-01 ENCOUNTER — Ambulatory Visit (INDEPENDENT_AMBULATORY_CARE_PROVIDER_SITE_OTHER): Payer: PRIVATE HEALTH INSURANCE

## 2023-02-01 DIAGNOSIS — N882 Stricture and stenosis of cervix uteri: Secondary | ICD-10-CM

## 2023-02-01 DIAGNOSIS — Z30430 Encounter for insertion of intrauterine contraceptive device: Secondary | ICD-10-CM | POA: Diagnosis not present

## 2023-02-01 DIAGNOSIS — Z538 Procedure and treatment not carried out for other reasons: Secondary | ICD-10-CM

## 2023-02-01 DIAGNOSIS — Z01812 Encounter for preprocedural laboratory examination: Secondary | ICD-10-CM

## 2023-02-01 LAB — PREGNANCY, URINE: Preg Test, Ur: NEGATIVE

## 2023-02-01 NOTE — Progress Notes (Signed)
GYNECOLOGY  VISIT   HPI: 47 y.o.   Single White or Caucasian Hispanic or Latino  female   G78P4 with No LMP recorded. (Menstrual status: IUD).   here for IUD insertion under ultrasound guidance. I was unable to get into her endometrial cavity last month. She has a known h/o multiple large nabothian cysts. She has been pretreated with cytotec.   GYNECOLOGIC HISTORY: No LMP recorded. (Menstrual status: IUD). Contraception:tubal ligation Menopausal hormone therapy: no        OB History     Gravida  4   Para  4   Term      Preterm      AB      Living  4      SAB      IAB      Ectopic      Multiple      Live Births                 Patient Active Problem List   Diagnosis Date Noted   Acute calculous cholecystitis 07/27/2019   BMI 45.0-49.9, adult (HCC) 01/16/2019   Prediabetes    Plantar fasciitis of left foot 08/26/2017   Pes planus, flexible 08/26/2017   IUD (intrauterine device) in place 11/14/2013    Past Medical History:  Diagnosis Date   Anemia    Prediabetes     Past Surgical History:  Procedure Laterality Date   BREAST BIOPSY  08/09/1998   benign   CHOLECYSTECTOMY N/A 07/28/2019   Procedure: LAPAROSCOPIC CHOLECYSTECTOMY WITH INTRAOPERATIVE CHOLANGIOGRAM;  Surgeon: Karie Soda, MD;  Location: [facility];  Service: General;  Laterality: N/A;   TUBAL LIGATION  08/09/2002   postpartum    Current Outpatient Medications  Medication Sig Dispense Refill   ascorbic acid (VITAMIN C) 500 MG tablet Take 500 mg by mouth.     Cholecalciferol (VITAMIN D) 125 MCG (5000 UT) CAPS Take 5,000 Units by mouth daily.     cyanocobalamin (VITAMIN B12) 500 MCG tablet Take 500 mcg by mouth daily.     levonorgestrel (MIRENA) 20 MCG/24HR IUD 1 Intra Uterine Device (1 each total) by Intrauterine route once. 1 Intra Uterine Device 0   LORazepam (ATIVAN) 1 MG tablet Take 1 tablet 1 hour prior to IUD insertion 1 tablet 0   magnesium gluconate (MAGONATE) 500 MG tablet Take  500 mg by mouth 2 (two) times daily.     misoprostol (CYTOTEC) 200 MCG tablet Place 2 tablets vaginally 6-12 hours prior to IUD insertion 2 tablet 0   Multiple Vitamin (MULTI VITAMIN DAILY PO) Take by mouth.     No current facility-administered medications for this visit.     ALLERGIES: Patient has no known allergies.  Family History  Problem Relation Age of Onset   Depression Mother    Stroke Mother 62   Diabetes Maternal Grandmother    Congestive Heart Failure Maternal Grandmother    Breast cancer Cousin 72       paternal first cousin   Colon cancer Neg Hx    Colon polyps Neg Hx    Esophageal cancer Neg Hx    Stomach cancer Neg Hx    Rectal cancer Neg Hx     Social History   Socioeconomic History   Marital status: Single    Spouse name: Not on file   Number of children: 4   Years of education: Not on file   Highest education level: Not on file  Occupational History   Occupation:  works in Presenter, broadcasting and as an Engineer, technical sales at [employer]   Tobacco Use   Smoking status: Never   Smokeless tobacco: Never  Vaping Use   Vaping Use: Never used  Substance and Sexual Activity   Alcohol use: Yes    Comment: occ wine   Drug use: No   Sexual activity: Not Currently    Partners: Male    Birth control/protection: I.U.D.  Other Topics Concern   Not on file  Social History Narrative   Not on file   Social Determinants of Health   Financial Resource Strain: Not on file  Food Insecurity: Not on file  Transportation Needs: Not on file  Physical Activity: Not on file  Stress: Not on file  Social Connections: Not on file  Intimate Partner Violence: Not on file    ROS  PHYSICAL EXAMINATION:    There were no vitals taken for this visit.    General appearance: alert, cooperative and appears stated age  Pelvic: External genitalia:  no lesions              Urethra:  normal appearing urethra with no masses, tenderness or lesions              Bartholins and  Skenes: normal                 Vagina: normal appearing vagina with normal color and discharge, no lesions              Cervix: no lesions               Chaperone was present for exam.  Limited ultrasound showed a normal sized anteverted uterus.   The risks of the mirena IUD were reviewed with the patient, including infection, abnormal bleeding and uterine perfortion. Consent was signed.  A speculum was placed in the vagina, the cervix was cleansed with betadine. A tenaculum was placed on the cervix, the cervix was carefully dilated under ultrasound guidance starting with a mini-dilator to a #15 pratt dilator. The hagar dilators would not go through the internal os. The uterus sounded to ~7 cm.  The mirena IUD was inserted with ultrasound guidance. The string were cut to 3 cm.    The patient tolerated the procedure well.   Post IUD insertion transvaginal ultrasound confirmed proper IUD location.   1. Encounter for IUD insertion Mirena inserted with U/S guidance. Needs the IUD for cycle control.  - IUD Insertion -F/U in one month  2. Pre-procedure lab exam - Pregnancy, urine  3. Cervical stenosis (uterine cervix) Was pretreated with cytotec.

## 2023-02-15 ENCOUNTER — Encounter: Payer: Self-pay | Admitting: Obstetrics and Gynecology

## 2023-03-01 ENCOUNTER — Telehealth: Payer: Self-pay | Admitting: Obstetrics and Gynecology

## 2023-03-01 NOTE — Telephone Encounter (Signed)
Patient needed to return for iud folow up. Left message on June 26 and July 1 and mailed letter for patient to call and schedule appointment. Patient has not called back.

## 2023-03-02 NOTE — Progress Notes (Signed)
GYNECOLOGY  VISIT   HPI: 47 y.o.   Single  Caucasian/ Hispanic  female   G4P4 with No LMP recorded. (Menstrual status: IUD).   here for IUD check. Light spotting last night.  Had IUD insertion under US guidance due to known multiple Nabothian cysts.  Post IUD insertion [place] confirmed proper location of the IUD.   No painful intercourse for patient or partner.   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  Working for [employer].  4 children.   GYNECOLOGIC HISTORY: No LMP recorded. (Menstrual status: IUD). Contraception:  IUD--Mirena 02/01/23, tubal ligation.  Menopausal hormone therapy:  n/a Last mammogram:  07/20/22 Breast Density Cat A, BI-RADS CAT 1 neg Last pap smear:   11/20/21 ASCUS: HR HPV neg, 10/17/20 - normal, endometrial cells,  neg HR HPV, 08/08/19 - LGSIL, neg HR HPV.        OB History     Gravida  4   Para  4   Term      Preterm      AB      Living  4      SAB      IAB      Ectopic      Multiple      Live Births                 Patient Active Problem List   Diagnosis Date Noted   Acute calculous cholecystitis 07/27/2019   BMI 45.0-49.9, adult (HCC) 01/16/2019   Prediabetes    Plantar fasciitis of left foot 08/26/2017   Pes planus, flexible 08/26/2017   IUD (intrauterine device) in place 11/14/2013    Past Medical History:  Diagnosis Date   Anemia    Prediabetes     Past Surgical History:  Procedure Laterality Date   BREAST BIOPSY  08/09/1998   benign   CHOLECYSTECTOMY N/A 07/28/2019   Procedure: LAPAROSCOPIC CHOLECYSTECTOMY WITH INTRAOPERATIVE CHOLANGIOGRAM;  Surgeon: Karie Soda, MD;  Location: [facility];  Service: General;  Laterality: N/A;   TUBAL LIGATION  08/09/2002   postpartum    Current Outpatient Medications  Medication Sig Dispense Refill   ascorbic acid (VITAMIN C) 500 MG tablet Take 500 mg by mouth.     Cholecalciferol (VITAMIN D) 125 MCG (5000 UT) CAPS Take 5,000 Units by mouth daily.     cyanocobalamin (VITAMIN B12) 500  MCG tablet Take 500 mcg by mouth daily.     levonorgestrel (MIRENA) 20 MCG/24HR IUD 1 Intra Uterine Device (1 each total) by Intrauterine route once. 1 Intra Uterine Device 0   LORazepam (ATIVAN) 1 MG tablet Take 1 tablet 1 hour prior to IUD insertion 1 tablet 0   magnesium gluconate (MAGONATE) 500 MG tablet Take 500 mg by mouth 2 (two) times daily.     misoprostol (CYTOTEC) 200 MCG tablet Place 2 tablets vaginally 6-12 hours prior to IUD insertion 2 tablet 0   Multiple Vitamin (MULTI VITAMIN DAILY PO) Take by mouth.     No current facility-administered medications for this visit.     ALLERGIES: Patient has no known allergies.  Family History  Problem Relation Age of Onset   Depression Mother    Stroke Mother 5   Diabetes Maternal Grandmother    Congestive Heart Failure Maternal Grandmother    Breast cancer Cousin 44       paternal first cousin   Colon cancer Neg Hx    Colon polyps Neg Hx    Esophageal cancer Neg  Hx    Stomach cancer Neg Hx    Rectal cancer Neg Hx     Social History   Socioeconomic History   Marital status: Single    Spouse name: Not on file   Number of children: 4   Years of education: Not on file   Highest education level: Not on file  Occupational History   Occupation: works in Presenter, broadcasting and as an Engineer, technical sales at [employer]   Tobacco Use   Smoking status: Never   Smokeless tobacco: Never  Vaping Use   Vaping status: Never Used  Substance and Sexual Activity   Alcohol use: Yes    Comment: occ wine   Drug use: No   Sexual activity: Yes    Partners: Male    Birth control/protection: I.U.D.  Other Topics Concern   Not on file  Social History Narrative   Not on file   Social Determinants of Health   Financial Resource Strain: Not on file  Food Insecurity: Not on file  Transportation Needs: Not on file  Physical Activity: Not on file  Stress: Not on file  Social Connections: Unknown (05/17/2022)   Received from [facility]    Social Network    Social Network: Not on file  Intimate Partner Violence: Unknown (05/17/2022)   Received from [facility]   HITS    Physically Hurt: Not on file    Insult or Talk Down To: Not on file    Threaten Physical Harm: Not on file    Scream or Curse: Not on file    Review of Systems  All other systems reviewed and are negative.   PHYSICAL EXAMINATION:    BP 126/84 (BP Location: Left Arm, Patient Position: Sitting, Cuff Size: Normal)   Pulse 82   Ht 5\' 3"  (1.6 m)   Wt 244 lb (110.7 kg)   SpO2 97%   BMI 43.22 kg/m     General appearance: alert, cooperative and appears stated age   Pelvic: External genitalia:  no lesions              Urethra:  normal appearing urethra with no masses, tenderness or lesions              Bartholins and Skenes: normal                 Vagina: normal appearing vagina with normal color and discharge, no lesions              Cervix: no lesions.  IUD strings noted.  Minor spotting.                 Bimanual Exam:  Uterus:  normal size, contour, position, consistency, mobility, non-tender              Adnexa: no mass, fullness, tenderness          Chaperone was present for exam:  Warren Lacy, CMA  ASSESSMENT  Mirena IUD check up.  Doing well.   Hx ASCUS pap, neg HR HPV.   PLAN  Reassurance regarding IUD position and spotting.  IUD effective for 8 years.  Pap and HR HPV in 2026 Return for annual exam in May, 2025 and prn.  20 min  total time was spent for this patient encounter, including preparation, face-to-face counseling with the patient, coordination of care, and documentation of the encounter.

## 2023-03-02 NOTE — Telephone Encounter (Signed)
Mirena IUD placed 02/01/23 under US guidance by JJ.   Spoke with patient.  OV scheduled with Dr. Edward Jolly for 03/16/23 at 1330. Patient declined earlier appt.   Routing to provider for final review. Patient is agreeable to disposition. Will close encounter.

## 2023-03-16 ENCOUNTER — Ambulatory Visit: Payer: PRIVATE HEALTH INSURANCE | Admitting: Obstetrics and Gynecology

## 2023-03-16 ENCOUNTER — Encounter: Payer: Self-pay | Admitting: Obstetrics and Gynecology

## 2023-03-16 VITALS — BP 126/84 | HR 82 | Ht 63.0 in | Wt 244.0 lb

## 2023-03-16 DIAGNOSIS — Z30431 Encounter for routine checking of intrauterine contraceptive device: Secondary | ICD-10-CM

## 2023-08-09 ENCOUNTER — Encounter: Payer: Self-pay | Admitting: Obstetrics and Gynecology

## 2024-04-23 ENCOUNTER — Telehealth: Payer: Self-pay

## 2024-04-23 DIAGNOSIS — Z30431 Encounter for routine checking of intrauterine contraceptive device: Secondary | ICD-10-CM

## 2024-04-23 DIAGNOSIS — M545 Low back pain, unspecified: Secondary | ICD-10-CM

## 2024-04-23 DIAGNOSIS — N939 Abnormal uterine and vaginal bleeding, unspecified: Secondary | ICD-10-CM

## 2024-04-23 NOTE — Telephone Encounter (Signed)
 Pt called the triage line stating that she is having lower back pain on both sides. The pain comes and goes. She is having a dark/black  colored discharged/bleeding. She wanted to know what she needs to do.   She states that she is spotting often and she know its not urinary related.   She only wants to see Dr. Nikki.  Please advise   Pt can be reached at [phone].  Please leave a detailed message if she doesn't answer.

## 2024-04-23 NOTE — Telephone Encounter (Signed)
 Let's start by getting a pelvic ultrasound for abnormal uterine bleeding and low back pain.   Then follow up with me at 1:45 on 9/24? This would be a work in slot.

## 2024-04-24 NOTE — Telephone Encounter (Signed)
 Please scheduled pt for a pelvic ultrasound for abnormal uterine bleeding and low back pain.    Then follow up with Dr. Nikki at 1:45 on 9/24? This would be a work in slot.  Order has been placed.  Transferred the call to [place] to schedule

## 2024-04-24 NOTE — Telephone Encounter (Signed)
 Pt has been scheduled for US  and APPT w/BS

## 2024-04-25 ENCOUNTER — Ambulatory Visit (INDEPENDENT_AMBULATORY_CARE_PROVIDER_SITE_OTHER): Payer: PRIVATE HEALTH INSURANCE

## 2024-04-25 DIAGNOSIS — N939 Abnormal uterine and vaginal bleeding, unspecified: Secondary | ICD-10-CM

## 2024-04-25 DIAGNOSIS — M545 Low back pain, unspecified: Secondary | ICD-10-CM | POA: Diagnosis not present

## 2024-04-25 DIAGNOSIS — Z30431 Encounter for routine checking of intrauterine contraceptive device: Secondary | ICD-10-CM

## 2024-05-02 ENCOUNTER — Encounter: Payer: Self-pay | Admitting: Obstetrics and Gynecology

## 2024-05-02 ENCOUNTER — Ambulatory Visit: Payer: PRIVATE HEALTH INSURANCE | Admitting: Obstetrics and Gynecology

## 2024-05-02 VITALS — BP 122/82 | HR 90

## 2024-05-02 DIAGNOSIS — N939 Abnormal uterine and vaginal bleeding, unspecified: Secondary | ICD-10-CM | POA: Diagnosis not present

## 2024-05-02 DIAGNOSIS — Z30431 Encounter for routine checking of intrauterine contraceptive device: Secondary | ICD-10-CM

## 2024-05-02 DIAGNOSIS — R102 Pelvic and perineal pain: Secondary | ICD-10-CM

## 2024-05-02 NOTE — Progress Notes (Signed)
 GYNECOLOGY  VISIT   HPI: 48 y.o.   Single  Caucasian /Hispanic female   G4P4 with Patient's last menstrual period was 04/11/2024 (approximate).   here for: U/S Consult      Bleeding, cramping and aching started 3 days ago.  Bleeding became black in color.  Had low back pain.  The bleeding stopped the day after her pelvic US .   Has low back pain in general for years.    Had tubal.  Has Mirena  IUD to treat anemia.    No partner change for a few years.   Declines STD screening.   Took birth control pills and Depo Provera in the past.    GYNECOLOGIC HISTORY: Patient's last menstrual period was 04/11/2024 (approximate). Contraception:  IUD- Mirena  02/01/23   Menopausal hormone therapy:  n/a Last 2 paps:  11/20/21 ASCUS, HR HPV neg, 10/17/20 neg HR HPV neg History of abnormal Pap or positive HPV:  yes Mammogram:  08/05/23 Breast Density Cat A, BIRADS Cat 1 neg        OB History     Gravida  4   Para  4   Term      Preterm      AB      Living  4      SAB      IAB      Ectopic      Multiple      Live Births                 Patient Active Problem List   Diagnosis Date Noted   Acute calculous cholecystitis 07/27/2019   BMI 45.0-49.9, adult (HCC) 01/16/2019   Prediabetes    Plantar fasciitis of left foot 08/26/2017   Pes planus, flexible 08/26/2017   IUD (intrauterine device) in place 11/14/2013    Past Medical History:  Diagnosis Date   Anemia    Prediabetes     Past Surgical History:  Procedure Laterality Date   BREAST BIOPSY  08/09/1998   benign   CHOLECYSTECTOMY N/A 07/28/2019   Procedure: LAPAROSCOPIC CHOLECYSTECTOMY WITH INTRAOPERATIVE CHOLANGIOGRAM;  Surgeon: Sheldon Standing, MD;  Location: [facility];  Service: General;  Laterality: N/A;   TUBAL LIGATION  08/09/2002   postpartum    Current Outpatient Medications  Medication Sig Dispense Refill   ascorbic acid (VITAMIN C) 500 MG tablet Take 500 mg by mouth.     Cholecalciferol (VITAMIN  D) 125 MCG (5000 UT) CAPS Take 5,000 Units by mouth daily.     cyanocobalamin (VITAMIN B12) 500 MCG tablet Take 500 mcg by mouth daily.     famotidine  (PEPCID ) 40 MG tablet Take 40 mg by mouth daily.     levonorgestrel  (MIRENA ) 20 MCG/24HR IUD 1 Intra Uterine Device (1 each total) by Intrauterine route once. 1 Intra Uterine Device 0   magnesium gluconate (MAGONATE) 500 MG tablet Take 500 mg by mouth 2 (two) times daily.     Multiple Vitamin (MULTI VITAMIN DAILY PO) Take by mouth.     No current facility-administered medications for this visit.     ALLERGIES: Patient has no known allergies.  Family History  Problem Relation Age of Onset   Depression Mother    Stroke Mother 2   Diabetes Maternal Grandmother    Congestive Heart Failure Maternal Grandmother    Breast cancer Cousin 22       paternal first cousin   Colon cancer Neg Hx    Colon polyps Neg Hx  Esophageal cancer Neg Hx    Stomach cancer Neg Hx    Rectal cancer Neg Hx     Social History   Socioeconomic History   Marital status: Single    Spouse name: Not on file   Number of children: 4   Years of education: Not on file   Highest education level: Not on file  Occupational History   Occupation: works in Presenter, broadcasting and as an Engineer, technical sales at [employer]   Tobacco Use   Smoking status: Never   Smokeless tobacco: Never  Vaping Use   Vaping status: Never Used  Substance and Sexual Activity   Alcohol use: Yes    Comment: occ wine   Drug use: No   Sexual activity: Yes    Partners: Male    Birth control/protection: I.U.D.    Comment: Mirena  02/01/23  Other Topics Concern   Not on file  Social History Narrative   Not on file   Social Drivers of Health   Financial Resource Strain: Not on file  Food Insecurity: Not on file  Transportation Needs: Not on file  Physical Activity: Not on file  Stress: Not on file  Social Connections: Unknown (05/17/2022)   Received from [facility]   Social Network     Social Network: Not on file  Intimate Partner Violence: Unknown (05/17/2022)   Received from [facility]   HITS    Physically Hurt: Not on file    Insult or Talk Down To: Not on file    Threaten Physical Harm: Not on file    Scream or Curse: Not on file    Review of Systems  All other systems reviewed and are negative.   PHYSICAL EXAMINATION:   BP 122/82 (BP Location: Left Arm, Patient Position: Sitting)   Pulse 90   LMP 04/11/2024 (Approximate)   SpO2 98%     General appearance: alert, cooperative and appears stated age  Pelvic: External genitalia:  no lesions              Urethra:  normal appearing urethra with no masses, tenderness or lesions              Bartholins and Skenes: normal                 Vagina: normal appearing vagina with normal color and discharge, no lesions              Cervix: no lesions.  IUD threads noted.   No Nabothian cysts noted on vaginal exam.                 Bimanual Exam:  Uterus:  normal size, contour, position, consistency, mobility, non-tender              Adnexa: no mass, fullness, tenderness            Chaperone was present for exam:  Kari HERO, CMA  Pelvic US : Uterus 9.96 x 6.15 x 4.39 cm.  No myometrial masses.  EMS 3.8 mm.  IUD noted centrally in the endometrial canal.  Large nabothian cysts noted.  Left ovary 2.39 x 1.86 x 1.74 cm. Right ovary 3.08 x 2.50 x 2.03 cm.  15.3 mm follicle.  No adnexal masses.  No free fluid.  ASSESSMENT:  Abnormal uterine bleeding.  Pelvic cramping.  Mirena  IUD. Status post BTL.  PLAN:  Pelvic US  images and report reviewed.   Reassurance given.  Declines adding COCs to her current regimen.  Ibuprofen prn.  Fu for annual exam.   25 min  total time was spent for this patient encounter, including preparation, face-to-face counseling with the patient, coordination of care, and documentation of the encounter.

## 2024-05-18 ENCOUNTER — Ambulatory Visit: Payer: PRIVATE HEALTH INSURANCE | Admitting: Nurse Practitioner

## 2024-08-14 LAB — HM MAMMOGRAPHY

## 2024-08-17 ENCOUNTER — Ambulatory Visit: Payer: Self-pay | Admitting: Obstetrics and Gynecology

## 2024-12-17 ENCOUNTER — Ambulatory Visit: Payer: PRIVATE HEALTH INSURANCE | Admitting: Obstetrics and Gynecology

## 2024-12-24 ENCOUNTER — Ambulatory Visit: Payer: PRIVATE HEALTH INSURANCE | Admitting: Obstetrics and Gynecology
